# Patient Record
Sex: Male | Born: 1995 | Race: White | Hispanic: Yes | Marital: Married | Smoking: Never smoker
Health system: Southern US, Community
[De-identification: ages and names within clinical notes are randomized; demographics above are authoritative.]

## PROBLEM LIST (undated history)

## (undated) DIAGNOSIS — Q8500 Neurofibromatosis, unspecified: Secondary | ICD-10-CM

## (undated) HISTORY — DX: Neurofibromatosis, unspecified: Q85.00

## (undated) HISTORY — PX: NO PAST SURGERIES: SHX2092

---
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
## 2013-03-22 ENCOUNTER — Emergency Department: Payer: Self-pay | Admitting: Emergency Medicine

## 2013-03-22 LAB — URINALYSIS, COMPLETE
Bacteria: NONE SEEN
Bilirubin,UR: NEGATIVE
Glucose,UR: NEGATIVE mg/dL (ref 0–75)
Nitrite: NEGATIVE
Protein: NEGATIVE
Specific Gravity: 1.021 (ref 1.003–1.030)

## 2013-03-22 LAB — CBC
HCT: 44.1 % (ref 40.0–52.0)
HGB: 15.2 g/dL (ref 13.0–18.0)
MCH: 28.3 pg (ref 26.0–34.0)
MCHC: 34.4 g/dL (ref 32.0–36.0)
RDW: 13 % (ref 11.5–14.5)
WBC: 9.6 10*3/uL (ref 3.8–10.6)

## 2013-03-22 LAB — LIPASE, BLOOD: Lipase: 88 U/L (ref 73–393)

## 2013-03-23 LAB — COMPREHENSIVE METABOLIC PANEL
Albumin: 4.3 g/dL (ref 3.8–5.6)
Alkaline Phosphatase: 94 U/L — ABNORMAL LOW (ref 98–317)
Anion Gap: 0 — ABNORMAL LOW (ref 7–16)
Bilirubin,Total: 1.1 mg/dL — ABNORMAL HIGH (ref 0.2–1.0)
Chloride: 110 mmol/L — ABNORMAL HIGH (ref 98–107)
Co2: 28 mmol/L (ref 21–32)
Creatinine: 0.87 mg/dL (ref 0.60–1.30)
Glucose: 81 mg/dL (ref 65–99)
Osmolality: 274 (ref 275–301)
Potassium: 4.2 mmol/L (ref 3.5–5.1)
SGOT(AST): 36 U/L (ref 10–41)
SGPT (ALT): 57 U/L (ref 12–78)
Sodium: 138 mmol/L (ref 136–145)
Total Protein: 7.4 g/dL (ref 6.4–8.6)

## 2014-01-21 DIAGNOSIS — Q8501 Neurofibromatosis, type 1: Secondary | ICD-10-CM | POA: Insufficient documentation

## 2014-07-27 ENCOUNTER — Emergency Department: Payer: Self-pay | Admitting: Emergency Medicine

## 2016-02-20 ENCOUNTER — Emergency Department
Admission: EM | Admit: 2016-02-20 | Discharge: 2016-02-20 | Disposition: A | Discharge status: Home / Self Care | Payer: BLUE CROSS/BLUE SHIELD | Attending: Emergency Medicine | Admitting: Emergency Medicine

## 2016-02-20 ENCOUNTER — Encounter: Payer: Self-pay | Admitting: Emergency Medicine

## 2016-02-20 ENCOUNTER — Emergency Department: Payer: BLUE CROSS/BLUE SHIELD

## 2016-02-20 DIAGNOSIS — K8020 Calculus of gallbladder without cholecystitis without obstruction: Secondary | ICD-10-CM

## 2016-02-20 DIAGNOSIS — R1011 Right upper quadrant pain: Secondary | ICD-10-CM | POA: Diagnosis present

## 2016-02-20 LAB — COMPREHENSIVE METABOLIC PANEL
ALBUMIN: 4.9 g/dL (ref 3.5–5.0)
ALT: 43 U/L (ref 17–63)
ANION GAP: 7 (ref 5–15)
AST: 32 U/L (ref 15–41)
Alkaline Phosphatase: 68 U/L (ref 38–126)
BUN: 11 mg/dL (ref 6–20)
CALCIUM: 9.6 mg/dL (ref 8.9–10.3)
CHLORIDE: 110 mmol/L (ref 101–111)
CO2: 24 mmol/L (ref 22–32)
CREATININE: 0.83 mg/dL (ref 0.61–1.24)
GFR calc non Af Amer: >60 mL/min (ref >60)
Glucose, Bld: 95 mg/dL (ref 65–99)
POTASSIUM: 3.9 mmol/L (ref 3.5–5.1)
SODIUM: 141 mmol/L (ref 135–145)
Total Bilirubin: 1.6 mg/dL — ABNORMAL HIGH (ref 0.3–1.2)
Total Protein: 7.7 g/dL (ref 6.5–8.1)

## 2016-02-20 LAB — CBC
HCT: 48.7 % (ref 40.0–52.0)
Hemoglobin: 16.6 g/dL (ref 13.0–18.0)
MCH: 28.2 pg (ref 26.0–34.0)
MCHC: 34.1 g/dL (ref 32.0–36.0)
MCV: 82.7 fL (ref 80.0–100.0)
PLATELETS: 221 10*3/uL (ref 150–440)
RBC: 5.89 MIL/uL (ref 4.40–5.90)
RDW: 13.3 % (ref 11.5–14.5)
WBC: 7.9 10*3/uL (ref 3.8–10.6)

## 2016-02-20 LAB — LIPASE, BLOOD: LIPASE: 22 U/L (ref 11–51)

## 2016-02-20 LAB — TROPONIN I

## 2016-02-20 MED ORDER — HYDROCODONE-ACETAMINOPHEN 5-325 MG PO TABS: 1.0000 | ORAL_TABLET | Freq: Four times a day (QID) | ORAL | 0 refills | Status: DC | PRN

## 2016-02-20 NOTE — ED Triage Notes (Signed)
R upper abdominal pain x 3 days.

## 2016-02-20 NOTE — Discharge Instructions (Signed)
Please return immediately if condition worsens. Please contact her primary physician or the physician you were given for referral. If you have any specialist physicians involved in her treatment and plan please also contact them. Thank you for using [facility].  Return to the emergency department especially for unrelieved pain, vomiting, fever, or any other new concerns.

## 2016-02-20 NOTE — ED Provider Notes (Signed)
Time Seen: Approximately 1458 I have reviewed the triage notes  Chief Complaint: Abdominal Pain   History of Present Illness: Philip Cortez is a 20 y.o. male who presents with some intermittent right upper quadrant abdominal pain over the last 3 days. No obvious exacerbating or relieving factors. He states he's been doing a lot of sit ups to try to get in shape. He denies any fever at home, nausea, vomiting, epigastric pain, diarrhea, constipation etc. He denies any right lower quadrant abdominal pain. 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   History reviewed. No pertinent past medical history.  There are no active problems to display for this patient.   History reviewed. No pertinent surgical history.  History reviewed. No pertinent surgical history.  Current Outpatient Rx  . Order #: [id number] Class: Print    Allergies:  Review of patient's allergies indicates no known allergies.  Family History: No family history on file.  Social History: Social History  Substance Use Topics  . Smoking status: Never Smoker  . Smokeless tobacco: Not on file  . Alcohol use Yes     Review of Systems:   10 point review of systems was performed and was otherwise negative:  Constitutional: No fever Eyes: No visual disturbances ENT: No sore throat, ear pain Cardiac: No chest pain Respiratory: No shortness of breath, wheezing, or stridor Abdomen: Abdominal pain is right upper quadrant and not present during the time of evaluation Endocrine: No weight loss, No night sweats Extremities: No peripheral edema, cyanosis Skin: No rashes, easy bruising Neurologic: No focal weakness, trouble with speech or swollowing Urologic: No dysuria, Hematuria, or urinary frequency   Physical Exam:  ED Triage Vitals  Enc Vitals Group     BP 02/20/16 1230 137/64     Pulse Rate 02/20/16 1230 (!) 57     Resp 02/20/16 1230 20     Temp 02/20/16 1230 98.2 F (36.8 C)     Temp  Source 02/20/16 1230 Oral     SpO2 02/20/16 1230 99 %     Weight 02/20/16 1233 220 lb (99.8 kg)     Height 02/20/16 1233 5\' 9"  (1.753 m)     Head Circumference --      Peak Flow --      Pain Score --      Pain Loc --      Pain Edu? --      Excl. in GC? --     General: Awake , Alert , and Oriented times 3; GCS 15 Head: Normal cephalic , atraumatic Eyes: Pupils equal , round, reactive to light Nose/Throat: No nasal drainage, patent upper airway without erythema or exudate.  Neck: Supple, Full range of motion, No anterior adenopathy or palpable thyroid masses Lungs: Clear to ascultation without wheezes , rhonchi, or rales Heart: Regular rate, regular rhythm without murmurs , gallops , or rubs Abdomen: Soft, non tender without rebound, guarding , or rigidity; bowel sounds positive and symmetric in all 4 quadrants. No organomegaly .  Negative Murphy's sign. No tenderness over McBurney's point      Extremities: 2 plus symmetric pulses. No edema, clubbing or cyanosis Neurologic: normal ambulation, Motor symmetric without deficits, sensory intact Skin: warm, dry, no rashes   Labs:   All laboratory work was reviewed including any pertinent negatives or positives listed below:  Labs Reviewed  COMPREHENSIVE METABOLIC PANEL - Abnormal; Notable for the following:       Result Value   Total Bilirubin 1.6 (*)  All other components within normal limits  LIPASE, BLOOD  CBC  TROPONIN I  Laboratory work was reviewed and showed no clinically significant abnormalities.   EKG:  ED ECG REPORT I, Jennye MoccasinBrian S Quigley, the attending physician, personally viewed and interpreted this ECG.  Date: 02/20/2016 EKG Time: 1239 Rate: *59 Rhythm: normal sinus rhythm QRS Axis: normal Intervals: normal ST/T Wave abnormalities: normal Conduction Disturbances: none Narrative Interpretation: unremarkable    Radiology: * "[place] Abdomen Limited Ruq  Result Date: 02/20/2016 CLINICAL DATA:  Right upper  quadrant abdominal pain for 3 days. EXAM: US ABDOMEN LIMITED - RIGHT UPPER QUADRANT COMPARISON:  None. FINDINGS: Gallbladder: 1.5 cm gallstone is noted in neck of gallbladder. No gallbladder wall thickening or pericholecystic fluid is noted. No sonographic Murphy sign noted by sonographer. Common bile duct: Diameter: 3.2 mm which is within normal limits. Liver: Increased echogenicity of hepatic parenchyma is noted consistent with fatty infiltration, with sparing noted around the gallbladder fossa. IMPRESSION: Fatty infiltration of the liver. Solitary gallstone is noted without gallbladder wall thickening or pericholecystic fluid. Electronically Signed   By: Lupita RaiderJames  Green Jr, M.D.   On: 02/20/2016 15:46  "  I personally reviewed the radiologic studies    ED Course:   Patient's stay here was uneventful is differential includes gastritis, pancreatitis, cholecystitis, musculoskeletal pain. I felt most likely his pain is either coming from a musculoskeletal source but there is no current reproducible component to his discomfort. He also has a stone that seems to be at the gallbladder neck which may be causing some occasional: The left cyanosis discomfort. The bile ducts appear normal and he is afebrile with a normal white count, etc. I felt he did not require immediate surgical evaluation but did refer him to general surgeon as an outpatient. She was advised to eat a low-fat diet and will be prescribed Norco for occasional pain.  Clinical Course     Assessment:  Cholelithiasis  Final Clinical Impression:   Final diagnoses:  Right upper quadrant pain  Gallstones without obstruction of gallbladder     Plan:  Outpatient Rx for Norco Patient was advised to return immediately if condition worsens. Patient was advised to follow up with their primary care physician or other specialized physicians involved in their outpatient care. The patient and/or family member/power of attorney had laboratory  results reviewed at the bedside. All questions and concerns were addressed and appropriate discharge instructions were distributed by the nursing staff.            Jennye MoccasinBrian S Quigley, MD 02/20/16 [phone]

## 2016-02-23 ENCOUNTER — Encounter: Payer: Self-pay | Admitting: Surgery

## 2016-02-23 ENCOUNTER — Ambulatory Visit (INDEPENDENT_AMBULATORY_CARE_PROVIDER_SITE_OTHER): Payer: BLUE CROSS/BLUE SHIELD | Admitting: Surgery

## 2016-02-23 ENCOUNTER — Ambulatory Visit: Payer: Self-pay | Admitting: Surgery

## 2016-02-23 VITALS — BP 150/80 | HR 65 | Temp 98.1°F | Ht 69.0 in | Wt 219.0 lb

## 2016-02-23 DIAGNOSIS — K8020 Calculus of gallbladder without cholecystitis without obstruction: Secondary | ICD-10-CM

## 2016-02-23 NOTE — Patient Instructions (Signed)
We have scheduled your surgery for 03/09/16 with Dr.Cooper. Please see your [facility] pre-care sheet for surgery information. Please call our office if you have questions or concerns.

## 2016-02-23 NOTE — Progress Notes (Signed)
Surgical Consultation  02/23/2016  Philip Cortez is an 20 y.o. male.   CC: gAll stones  HPI: This a patient with 2 episodes of right upper quadrant pain that lasted approximately 45 minutes one of which sent him to the emergency room. He was evaluated and there was no sign of acute cholecystitis with normal liver function tests. He has had no nausea or vomiting and no abnormalities of his bowel movements. And is never had episodes like this before.  Past Medical History:  Diagnosis Date  . Neurofibromatosis (HCC)     History reviewed. No pertinent surgical history.  Family History  Problem Relation Age of Onset  . Neurofibromatosis Mother   . Thyroid disease Mother   . Diabetes Paternal Uncle   . Diabetes Maternal Grandfather   . Diabetes Paternal Grandfather     Social History:  reports that he has never smoked. He has never used smokeless tobacco. He reports that he does not drink alcohol or use drugs. His mother had her gallbladder removed at age 22. 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 Allergies: No Known Allergies  Medications reviewed.   Review of Systems:   Review of Systems  Constitutional: Negative for chills and fever.  HENT: Negative.   Eyes: Negative.   Respiratory: Negative.   Cardiovascular: Negative.   Gastrointestinal: Positive for abdominal pain. Negative for blood in stool, constipation, diarrhea, heartburn, melena, nausea and vomiting.  Genitourinary: Negative.   Musculoskeletal: Negative.   Skin: Negative.   Neurological: Negative.   Endo/Heme/Allergies: Negative.   Psychiatric/Behavioral: Negative.      Physical Exam:  BP (!) 150/80   Pulse 65   Temp 98.1 F (36.7 C) (Oral)   Ht 5\' 9"  (1.753 m)   Wt 219 lb (99.3 kg)   BMI 32.34 kg/m   Physical Exam  Constitutional: He is oriented to person, place, and time and well-developed, well-nourished, and in no distress. No distress.  HENT:  Head: Normocephalic and  atraumatic.  Eyes: Pupils are equal, round, and reactive to light. Right eye exhibits no discharge. Left eye exhibits no discharge. No scleral icterus.  Neck: Normal range of motion.  Cardiovascular: Normal rate, regular rhythm and normal heart sounds.   Pulmonary/Chest: Effort normal and breath sounds normal. No respiratory distress. He has no wheezes. He has no rales.  Abdominal: Soft. He exhibits no distension. There is no tenderness. There is no rebound and no guarding.  Musculoskeletal: Normal range of motion. He exhibits no edema or tenderness.  Lymphadenopathy:    He has no cervical adenopathy.  Neurological: He is alert and oriented to person, place, and time.  Skin: Skin is warm and dry. No rash noted. He is not diaphoretic. No erythema.  Psychiatric: Mood and affect normal.  Vitals reviewed.     No results found for this or any previous visit (from the past 48 hour(s)). No results found.  Assessment/Plan:  This a patient with symptomatically lithiasis he has classic symptoms and requires laparoscopic cholecystectomy for control of his symptoms. He has started new job and will talk to his HR people about when to schedule this. I discussed with he and his family the rationale for offering surgery the options of observation risk bleeding infection recurrence of symptoms failure to resolve his symptoms conversion to an open procedure bile duct damage or bowel injury any of which could require further surgery this reviewed for him and multiple questions were answered he understood and agreed to proceed.  Florene Glen, MD, FACS

## 2016-02-25 ENCOUNTER — Telehealth: Payer: Self-pay | Admitting: Surgery

## 2016-02-25 NOTE — Telephone Encounter (Signed)
Pt advised of pre op date/time and sx date. Sx: 03/09/16 with Dr Ludwig Clarksooper--Laparoscopic cholecystectomy.  Pre op: 03/01/16 between 1-5:00pm--Phone.   Patient made aware to call [phone], between 1-3:00pm the day before surgery, to find out what time to arrive.     Patient advised of Physician Estimate--1095.96. Patient informed of the minimum deposit for surgery is 500.00 prior to surgery.

## 2016-03-01 ENCOUNTER — Encounter
Admission: RE | Admit: 2016-03-01 | Discharge: 2016-03-01 | Disposition: A | Discharge status: Home / Self Care | Payer: BLUE CROSS/BLUE SHIELD | Source: Ambulatory Visit | Attending: Surgery | Admitting: Surgery

## 2016-03-01 NOTE — Patient Instructions (Signed)
  Your procedure is scheduled on: 03-09-16 [facility](WEDNESDAY) Report to Same Day Surgery 2nd floor medical mall To find out your arrival time please call [phone] between 1PM - 3PM on 03-08-16 (TUESDAY)  Remember: Instructions that are not followed completely may result in serious medical risk, up to and including death, or upon the discretion of your surgeon and anesthesiologist your surgery may need to be rescheduled.    _x___ 1. Do not eat food or drink liquids after midnight. No gum chewing or hard candies.     __x__ 2. No Alcohol for 24 hours before or after surgery.   __x__3. No Smoking for 24 prior to surgery.   ____  4. Bring all medications with you on the day of surgery if instructed.    __x__ 5. Notify your doctor if there is any change in your medical condition     (cold, fever, infections).     Do not wear jewelry, make-up, hairpins, clips or nail polish.  Do not wear lotions, powders, or perfumes. You may wear deodorant.  Do not shave 48 hours prior to surgery. Men may shave face and neck.  Do not bring valuables to the hospital.    [facility] is not responsible for any belongings or valuables.               Contacts, dentures or bridgework may not be worn into surgery.  Leave your suitcase in the car. After surgery it may be brought to your room.  For patients admitted to the hospital, discharge time is determined by your treatment team.   Patients discharged the day of surgery will not be allowed to drive home.    Please read over the following fact sheets that you were given:   [facility] Preparing for Surgery and or MRSA Information   ____ Take these medicines the morning of surgery with A SIP OF WATER:    1. NONE  2.  3.  4.  5.  6.  ____Fleets enema or Magnesium Citrate as directed.   ____ Use CHG Soap or sage wipes as directed on instruction sheet   ____ Use inhalers on the day of surgery and bring to hospital day of surgery  ____ Stop metformin 2  days prior to surgery    ____ Take 1/2 of usual insulin dose the night before surgery and none on the morning of           surgery.   ____ Stop aspirin or coumadin, or plavix  x__ Stop Anti-inflammatories such as Advil, Aleve, Ibuprofen, Motrin, Naproxen,          Naprosyn, Goodies powders or aspirin products NOW-Ok to take Tylenol.   ____ Stop supplements until after surgery.    ____ Bring C-Pap to the hospital.

## 2016-03-09 ENCOUNTER — Ambulatory Visit: Payer: BLUE CROSS/BLUE SHIELD | Admitting: Registered Nurse

## 2016-03-09 ENCOUNTER — Encounter: Payer: Self-pay | Admitting: *Deleted

## 2016-03-09 ENCOUNTER — Ambulatory Visit
Admission: RE | Admit: 2016-03-09 | Discharge: 2016-03-09 | Disposition: A | Discharge status: Home / Self Care | Payer: BLUE CROSS/BLUE SHIELD | Source: Ambulatory Visit | Attending: Surgery | Admitting: Surgery

## 2016-03-09 ENCOUNTER — Encounter
Admission: RE | Disposition: A | Discharge status: Home / Self Care | Payer: Self-pay | Source: Ambulatory Visit | Attending: Surgery

## 2016-03-09 DIAGNOSIS — K8010 Calculus of gallbladder with chronic cholecystitis without obstruction: Secondary | ICD-10-CM | POA: Insufficient documentation

## 2016-03-09 DIAGNOSIS — Q8500 Neurofibromatosis, unspecified: Secondary | ICD-10-CM | POA: Diagnosis not present

## 2016-03-09 DIAGNOSIS — K8050 Calculus of bile duct without cholangitis or cholecystitis without obstruction: Secondary | ICD-10-CM

## 2016-03-09 DIAGNOSIS — Z419 Encounter for procedure for purposes other than remedying health state, unspecified: Secondary | ICD-10-CM

## 2016-03-09 HISTORY — PX: CHOLECYSTECTOMY: SHX55

## 2016-03-09 LAB — CBC WITH DIFFERENTIAL/PLATELET
Basophils Absolute: 0.1 10*3/uL (ref 0–0.1)
Basophils Relative: 1 %
EOS ABS: 0.1 10*3/uL (ref 0–0.7)
EOS PCT: 1 %
HCT: 47.8 % (ref 40.0–52.0)
HEMOGLOBIN: 16.7 g/dL (ref 13.0–18.0)
LYMPHS ABS: 2.2 10*3/uL (ref 1.0–3.6)
Lymphocytes Relative: 24 %
MCH: 28.6 pg (ref 26.0–34.0)
MCHC: 35.1 g/dL (ref 32.0–36.0)
MCV: 81.7 fL (ref 80.0–100.0)
MONO ABS: 0.7 10*3/uL (ref 0.2–1.0)
MONOS PCT: 8 %
NEUTROS PCT: 66 %
Neutro Abs: 6.3 10*3/uL (ref 1.4–6.5)
Platelets: 242 10*3/uL (ref 150–440)
RBC: 5.85 MIL/uL (ref 4.40–5.90)
RDW: 13.2 % (ref 11.5–14.5)
WBC: 9.4 10*3/uL (ref 3.8–10.6)

## 2016-03-09 LAB — COMPREHENSIVE METABOLIC PANEL
ALK PHOS: 70 U/L (ref 38–126)
ALT: 47 U/L (ref 17–63)
ANION GAP: 5 (ref 5–15)
AST: 32 U/L (ref 15–41)
Albumin: 4.7 g/dL (ref 3.5–5.0)
BILIRUBIN TOTAL: 1.2 mg/dL (ref 0.3–1.2)
BUN: 11 mg/dL (ref 6–20)
CALCIUM: 9.6 mg/dL (ref 8.9–10.3)
CO2: 26 mmol/L (ref 22–32)
Chloride: 109 mmol/L (ref 101–111)
Creatinine, Ser: 0.81 mg/dL (ref 0.61–1.24)
GFR calc non Af Amer: >60 mL/min (ref >60)
Glucose, Bld: 107 mg/dL — ABNORMAL HIGH (ref 65–99)
Potassium: 4.4 mmol/L (ref 3.5–5.1)
SODIUM: 140 mmol/L (ref 135–145)
TOTAL PROTEIN: 7.6 g/dL (ref 6.5–8.1)

## 2016-03-09 SURGERY — LAPAROSCOPIC CHOLECYSTECTOMY
Anesthesia: General

## 2016-03-09 MED ORDER — KETOROLAC TROMETHAMINE 30 MG/ML IJ SOLN
INTRAMUSCULAR | Status: DC | PRN
  Administered 2016-03-09: 30 mg via INTRAVENOUS

## 2016-03-09 MED ORDER — SUGAMMADEX SODIUM 200 MG/2ML IV SOLN
INTRAVENOUS | Status: DC | PRN
  Administered 2016-03-09: 200 mg via INTRAVENOUS

## 2016-03-09 MED ORDER — ACETAMINOPHEN 10 MG/ML IV SOLN
INTRAVENOUS | Status: AC
  Filled 2016-03-09: qty 100

## 2016-03-09 MED ORDER — CHLORHEXIDINE GLUCONATE CLOTH 2 % EX PADS: 6.0000 | MEDICATED_PAD | Freq: Once | CUTANEOUS | Status: DC

## 2016-03-09 MED ORDER — ROCURONIUM BROMIDE 100 MG/10ML IV SOLN
INTRAVENOUS | Status: DC | PRN
  Administered 2016-03-09: 10 mg via INTRAVENOUS
  Administered 2016-03-09: 30 mg via INTRAVENOUS

## 2016-03-09 MED ORDER — PROPOFOL 10 MG/ML IV BOLUS
INTRAVENOUS | Status: DC | PRN
  Administered 2016-03-09: 200 mg via INTRAVENOUS
  Administered 2016-03-09: 100 mg via INTRAVENOUS

## 2016-03-09 MED ORDER — FAMOTIDINE 20 MG PO TABS
20.0000 mg | ORAL_TABLET | Freq: Once | ORAL | Status: AC
  Administered 2016-03-09: 20 mg via ORAL

## 2016-03-09 MED ORDER — FAMOTIDINE 20 MG PO TABS
ORAL_TABLET | ORAL | Status: AC
  Filled 2016-03-09: qty 1

## 2016-03-09 MED ORDER — CEFAZOLIN SODIUM-DEXTROSE 2-4 GM/100ML-% IV SOLN
2.0000 g | INTRAVENOUS | Status: AC
  Administered 2016-03-09: 2 g via INTRAVENOUS

## 2016-03-09 MED ORDER — ONDANSETRON HCL 4 MG/2ML IJ SOLN
INTRAMUSCULAR | Status: DC | PRN
  Administered 2016-03-09: 4 mg via INTRAVENOUS

## 2016-03-09 MED ORDER — BUPIVACAINE-EPINEPHRINE (PF) 0.25% -1:200000 IJ SOLN
INTRAMUSCULAR | Status: AC
  Filled 2016-03-09: qty 30

## 2016-03-09 MED ORDER — BUPIVACAINE-EPINEPHRINE (PF) 0.25% -1:200000 IJ SOLN
INTRAMUSCULAR | Status: DC | PRN
  Administered 2016-03-09: 30 mL via PERINEURAL

## 2016-03-09 MED ORDER — FENTANYL CITRATE (PF) 100 MCG/2ML IJ SOLN
INTRAMUSCULAR | Status: DC | PRN
  Administered 2016-03-09 (×4): 50 ug via INTRAVENOUS

## 2016-03-09 MED ORDER — LACTATED RINGERS IV SOLN
INTRAVENOUS | Status: DC
  Administered 2016-03-09: 10:00:00 via INTRAVENOUS

## 2016-03-09 MED ORDER — FENTANYL CITRATE (PF) 100 MCG/2ML IJ SOLN: 25.0000 ug | INTRAMUSCULAR | Status: DC | PRN

## 2016-03-09 MED ORDER — ACETAMINOPHEN 10 MG/ML IV SOLN
INTRAVENOUS | Status: DC | PRN
  Administered 2016-03-09: 1000 mg via INTRAVENOUS

## 2016-03-09 MED ORDER — MIDAZOLAM HCL 2 MG/2ML IJ SOLN
INTRAMUSCULAR | Status: DC | PRN
  Administered 2016-03-09: 2 mg via INTRAVENOUS

## 2016-03-09 MED ORDER — ONDANSETRON HCL 4 MG/2ML IJ SOLN: 4.0000 mg | Freq: Once | INTRAMUSCULAR | Status: DC | PRN

## 2016-03-09 MED ORDER — LIDOCAINE HCL (CARDIAC) 20 MG/ML IV SOLN
INTRAVENOUS | Status: DC | PRN
  Administered 2016-03-09: 80 mg via INTRAVENOUS

## 2016-03-09 MED ORDER — HYDROCODONE-ACETAMINOPHEN 5-300 MG PO TABS: 1.0000 | ORAL_TABLET | ORAL | 0 refills | Status: DC | PRN

## 2016-03-09 MED ORDER — CEFAZOLIN SODIUM-DEXTROSE 2-4 GM/100ML-% IV SOLN
INTRAVENOUS | Status: AC
  Filled 2016-03-09: qty 100

## 2016-03-09 SURGICAL SUPPLY — 43 items
ADHESIVE MASTISOL STRL (MISCELLANEOUS) ×3 IMPLANT
APPLIER CLIP ROT 10 11.4 M/L (STAPLE) ×3
BLADE SURG SZ11 CARB STEEL (BLADE) ×3 IMPLANT
CANISTER SUCT 1200ML W/VALVE (MISCELLANEOUS) ×3 IMPLANT
CATH CHOLANGI 4FR 420404F (CATHETERS) IMPLANT
CHLORAPREP W/TINT 26ML (MISCELLANEOUS) ×3 IMPLANT
CLIP APPLIE ROT 10 11.4 M/L (STAPLE) ×1 IMPLANT
CLOSURE WOUND 1/2 X4 (GAUZE/BANDAGES/DRESSINGS) ×1
CONRAY 60ML FOR OR (MISCELLANEOUS) IMPLANT
DRAPE C-ARM XRAY 36X54 (DRAPES) IMPLANT
ELECT REM PT RETURN 9FT ADLT (ELECTROSURGICAL) ×3
ELECTRODE REM PT RTRN 9FT ADLT (ELECTROSURGICAL) ×1 IMPLANT
ENDOPOUCH RETRIEVER 10 (MISCELLANEOUS) ×3 IMPLANT
GAUZE SPONGE NON-WVN 2X2 STRL (MISCELLANEOUS) ×4 IMPLANT
GLOVE BIO SURGEON STRL SZ8 (GLOVE) ×3 IMPLANT
GOWN STRL REUS W/ TWL LRG LVL3 (GOWN DISPOSABLE) ×4 IMPLANT
GOWN STRL REUS W/TWL LRG LVL3 (GOWN DISPOSABLE) ×8
IRRIGATION STRYKERFLOW (MISCELLANEOUS) IMPLANT
IRRIGATOR STRYKERFLOW (MISCELLANEOUS)
IV CATH ANGIO 12GX3 LT BLUE (NEEDLE) ×3 IMPLANT
IV NS 1000ML (IV SOLUTION)
IV NS 1000ML BAXH (IV SOLUTION) IMPLANT
JACKSON PRATT 10 (INSTRUMENTS) IMPLANT
KIT RM TURNOVER STRD PROC AR (KITS) ×3 IMPLANT
LABEL OR SOLS (LABEL) ×3 IMPLANT
NDL SAFETY 22GX1.5 (NEEDLE) ×3 IMPLANT
NEEDLE VERESS 14GA 120MM (NEEDLE) ×3 IMPLANT
NS IRRIG 500ML POUR BTL (IV SOLUTION) ×3 IMPLANT
PACK LAP CHOLECYSTECTOMY (MISCELLANEOUS) ×3 IMPLANT
SCISSORS METZENBAUM CVD 33 (INSTRUMENTS) ×3 IMPLANT
SLEEVE ENDOPATH XCEL 5M (ENDOMECHANICALS) ×6 IMPLANT
SPONGE EXCIL AMD DRAIN 4X4 6P (MISCELLANEOUS) IMPLANT
SPONGE LAP 18X18 5 PK (GAUZE/BANDAGES/DRESSINGS) ×3 IMPLANT
SPONGE VERSALON 2X2 STRL (MISCELLANEOUS) ×8
STRIP CLOSURE SKIN 1/2X4 (GAUZE/BANDAGES/DRESSINGS) ×2 IMPLANT
SUT MNCRL 4-0 (SUTURE) ×2
SUT MNCRL 4-0 27XMFL (SUTURE) ×1
SUT VICRYL 0 AB UR-6 (SUTURE) ×3 IMPLANT
SUTURE MNCRL 4-0 27XMF (SUTURE) ×1 IMPLANT
SYR 20CC LL (SYRINGE) ×3 IMPLANT
TROCAR XCEL NON-BLD 11X100MML (ENDOMECHANICALS) ×3 IMPLANT
TROCAR XCEL NON-BLD 5MMX100MML (ENDOMECHANICALS) ×3 IMPLANT
TUBING INSUFFLATOR HI FLOW (MISCELLANEOUS) ×3 IMPLANT

## 2016-03-09 NOTE — Transfer of Care (Signed)
Immediate Anesthesia Transfer of Care Note  Patient: Philip PlanasMartin N Wissmann  Procedure(s) Performed: Procedure(s): LAPAROSCOPIC CHOLECYSTECTOMY (N/A)  Patient Location: PACU  Anesthesia Type:General  Level of Consciousness: sedated  Airway & Oxygen Therapy: Patient Spontanous Breathing and Patient connected to face mask oxygen  Post-op Assessment: Report given to RN and Post -op Vital signs reviewed and stable  Post vital signs: Reviewed and stable  Last Vitals:  Vitals:   03/09/16 0908  BP: 135/68  Pulse: 64  Resp: 16  Temp: 36.7 C    Last Pain:  Vitals:   03/09/16 0908  TempSrc: Oral         Complications: No apparent anesthesia complications

## 2016-03-09 NOTE — Anesthesia Preprocedure Evaluation (Addendum)
Anesthesia Evaluation  Patient identified by MRN, date of birth, ID band Patient awake    Reviewed: Allergy & Precautions, NPO status , Patient's Chart, lab work & pertinent test results  Airway Mallampati: II       Dental  (+) Chipped   Pulmonary neg pulmonary ROS,    Pulmonary exam normal        Cardiovascular negative cardio ROS Normal cardiovascular exam     Neuro/Psych negative psych ROS   GI/Hepatic Neg liver ROS,   Endo/Other    Renal/GU negative Renal ROS  negative genitourinary   Musculoskeletal   Abdominal Normal abdominal exam  (+)   Peds negative pediatric ROS (+)  Hematology negative hematology ROS (+)   Anesthesia Other Findings Past Medical History: No date: Neurofibromatosis [facility](HCC)  Reproductive/Obstetrics                            Anesthesia Physical Anesthesia Plan  ASA: II  Anesthesia Plan: General   Post-op Pain Management:    Induction: Intravenous  Airway Management Planned: Oral ETT  Additional Equipment:   Intra-op Plan:   Post-operative Plan: Extubation in OR  Informed Consent: I have reviewed the patients History and Physical, chart, labs and discussed the procedure including the risks, benefits and alternatives for the proposed anesthesia with the patient or authorized representative who has indicated his/her understanding and acceptance.   Dental advisory given  Plan Discussed with: CRNA and Surgeon  Anesthesia Plan Comments:         Anesthesia Quick Evaluation

## 2016-03-09 NOTE — Discharge Instructions (Signed)
Remove dressing in 24 hours. °May shower in 24 hours. °Leave paper strips in place. °Resume all home medications. °Follow-up with Dr. Cooper in 10 days. ° °AMBULATORY SURGERY  °DISCHARGE INSTRUCTIONS ° ° °1) The drugs that you were given will stay in your system until tomorrow so for the next 24 hours you should not: ° °A) Drive an automobile °B) Make any legal decisions °C) Drink any alcoholic beverage ° ° °2) You may resume regular meals tomorrow.  Today it is better to start with liquids and gradually work up to solid foods. ° °You may eat anything you prefer, but it is better to start with liquids, then soup and crackers, and gradually work up to solid foods. ° ° °3) Please notify your doctor immediately if you have any unusual bleeding, trouble breathing, redness and pain at the surgery site, drainage, fever, or pain not relieved by medication. ° ° ° °4) Additional Instructions: ° ° ° ° ° ° ° °Please contact your physician with any problems or Same Day Surgery at [phone], Monday through Friday 6 am to 4 pm, or [place] [facility] number at [phone]. °

## 2016-03-09 NOTE — Anesthesia Procedure Notes (Signed)
Procedure Name: Intubation Date/Time: 03/09/2016 11:23 AM Performed by: Karoline CaldwellSTARR, Tere Mcconaughey Pre-anesthesia Checklist: Patient identified, Emergency Drugs available, Suction available, Patient being monitored and Timeout performed Patient Re-evaluated:Patient Re-evaluated prior to inductionOxygen Delivery Method: Circle system utilized Preoxygenation: Pre-oxygenation with 100% oxygen Intubation Type: IV induction Ventilation: Mask ventilation without difficulty Laryngoscope Size: Mac and 4 Grade View: Grade I Tube type: Oral Tube size: 7.5 mm Number of attempts: 1 Airway Equipment and Method: Stylet Placement Confirmation: ETT inserted through vocal cords under direct vision,  positive ETCO2 and breath sounds checked- equal and bilateral Secured at: 23 (lip) cm Tube secured with: Tape Dental Injury: Teeth and Oropharynx as per pre-operative assessment

## 2016-03-09 NOTE — Anesthesia Postprocedure Evaluation (Signed)
Anesthesia Post Note  Patient: Iran PlanasMartin N Goerner  Procedure(s) Performed: Procedure(s) (LRB): LAPAROSCOPIC CHOLECYSTECTOMY (N/A)  Patient location during evaluation: PACU Anesthesia Type: General Level of consciousness: awake and alert and oriented Pain management: pain level controlled Vital Signs Assessment: post-procedure vital signs reviewed and stable Respiratory status: spontaneous breathing Cardiovascular status: blood pressure returned to baseline Anesthetic complications: no    Last Vitals:  Vitals:   03/09/16 1252 03/09/16 1302  BP: 135/81 114/75  Pulse: (!) 58 66  Resp: 16 18  Temp: 36.5 C 36.7 C    Last Pain:  Vitals:   03/09/16 1252  TempSrc:   PainSc: 0-No pain                 Christmas Faraci

## 2016-03-09 NOTE — Op Note (Signed)
Laparoscopic Cholecystectomy  Pre-operative Diagnosis: Biliary colic  Post-operative Diagnosis: Same  Procedure: Laparoscopic cholecystectomy  Surgeon: Adah Salvageichard E. Excell Seltzerooper, MD FACS  Anesthesia: Gen. with endotracheal tube  Assistant: PA student  Procedure Details  The patient was seen again in the Holding Room. The benefits, complications, treatment options, and expected outcomes were discussed with the patient. The risks of bleeding, infection, recurrence of symptoms, failure to resolve symptoms, bile duct damage, bile duct leak, retained common bile duct stone, bowel injury, any of which could require further surgery and/or ERCP, stent, or papillotomy were reviewed with the patient. The likelihood of improving the patient's symptoms with return to their baseline status is good.  The patient and/or family concurred with the proposed plan, giving informed consent.  The patient was taken to Operating Room, identified as Iran PlanasMartin N [place] and the procedure verified as Laparoscopic Cholecystectomy.  A Time Out was held and the above information confirmed.  Prior to the induction of general anesthesia, antibiotic prophylaxis was administered. VTE prophylaxis was in place. General endotracheal anesthesia was then administered and tolerated well. After the induction, the abdomen was prepped with Chloraprep and draped in the sterile fashion. The patient was positioned in the supine position.  Local anesthetic  was injected into the skin near the umbilicus and an incision made. The Veress needle was placed. Pneumoperitoneum was then created with CO2 and tolerated well without any adverse changes in the patient's vital signs. A 5mm port was placed in the periumbilical position and the abdominal cavity was explored.  Two 5-mm ports were placed in the right upper quadrant and a 12 mm epigastric port was placed all under direct vision. All skin incisions  were infiltrated with a local anesthetic agent before  making the incision and placing the trocars.   The patient was positioned  in reverse Trendelenburg, tilted slightly to the patient's left.  The gallbladder was identified, the fundus grasped and retracted cephalad. Adhesions were lysed bluntly. The infundibulum was grasped and retracted laterally, exposing the peritoneum overlying the triangle of Calot. This was then divided and exposed in a blunt fashion. A critical view of the cystic duct and cystic artery was obtained.  The cystic duct was clearly identified and bluntly dissected.   The cystic duct was well identified and doubly clipped and divided. The cystic artery was then doubly clipped and divided.  The gallbladder was taken from the gallbladder fossa in a retrograde fashion with the electrocautery. The gallbladder was removed and placed in an Endocatch bag. The liver bed was irrigated and inspected. Hemostasis was achieved with the electrocautery. Copious irrigation was utilized and was repeatedly aspirated until clear.  The gallbladder and Endocatch sac were then removed through the epigastric port site.   Inspection of the right upper quadrant was performed. No bleeding, bile duct injury or leak, or bowel injury was noted. Pneumoperitoneum was released.  The epigastric port site was closed with figure-of-eight 0 Vicryl sutures. 4-0 subcuticular Monocryl was used to close the skin. Steristrips and Mastisol and sterile dressings were  applied.  The patient was then extubated and brought to the recovery room in stable condition. Sponge, lap, and needle counts were correct at closure and at the conclusion of the case.   Findings: Chronic Cholecystitis   Estimated Blood Loss: Minimal         Drains: None         Specimens: Gallbladder           Complications: none  Blakelyn Dinges E. Burt Knack, MD, FACS

## 2016-03-09 NOTE — Progress Notes (Signed)
Preoperative Review   Patient is met in the preoperative holding area. The history is reviewed in the chart and with the patient. I personally reviewed the options and rationale as well as the risks of this procedure that have been previously discussed with the patient. All questions asked by the patient and/or family were answered to their satisfaction.  Patient agrees to proceed with this procedure at this time.  Philip Cortez E Niyah Mamaril M.D. FACS  

## 2016-03-09 NOTE — OR Nursing (Signed)
Lab tech in for cbc, met c draw

## 2016-03-11 LAB — SURGICAL PATHOLOGY

## 2016-03-12 ENCOUNTER — Encounter: Payer: Self-pay | Admitting: Surgery

## 2016-03-13 ENCOUNTER — Encounter: Payer: Self-pay | Admitting: Surgery

## 2016-03-23 ENCOUNTER — Ambulatory Visit: Payer: BLUE CROSS/BLUE SHIELD | Admitting: Surgery

## 2016-04-04 ENCOUNTER — Encounter: Payer: Self-pay | Admitting: Surgery

## 2016-04-04 ENCOUNTER — Ambulatory Visit (INDEPENDENT_AMBULATORY_CARE_PROVIDER_SITE_OTHER): Payer: BLUE CROSS/BLUE SHIELD | Admitting: Surgery

## 2016-04-04 VITALS — BP 143/78 | HR 59 | Temp 98.7°F | Ht 69.0 in | Wt 231.0 lb

## 2016-04-04 DIAGNOSIS — Z9049 Acquired absence of other specified parts of digestive tract: Secondary | ICD-10-CM

## 2016-04-04 DIAGNOSIS — K8050 Calculus of bile duct without cholangitis or cholecystitis without obstruction: Secondary | ICD-10-CM

## 2016-04-04 NOTE — Progress Notes (Signed)
04/04/2016  HPI: Patient is status post laparoscopic cholecystectomy with Dr. Excell Seltzerooper on 10/18. He presents today for postop visit. He reports that he's been doing well and tolerating a regular diet with no significant pain and normal bowel function. He has returned to work already and has not been having any issues.  Vital signs: BP (!) 143/78   Pulse (!) 59   Temp 98.7 F (37.1 C) (Oral)   Ht 5\' 9"  (1.753 m)   Wt 104.8 kg (231 lb)   BMI 34.11 kg/m    Physical Exam: Constitutional: No acute distress Abdomen: Soft, nondistended, nontender to palpation. All incisions are clean dry and intact and healing well with no evidence of infection.  Assessment/Plan: 20 year old male status post upper scopic cholecystectomy on 10/18.  -Patient may resume all activities on 11/15. -He may follow-up on an as-needed basis or if there are any questions or concerns about the wounds.   Howie IllJose Luis Kaleel Schmieder, MD [facility]

## 2016-04-04 NOTE — Patient Instructions (Signed)

## 2016-08-10 ENCOUNTER — Emergency Department
Admission: EM | Admit: 2016-08-10 | Discharge: 2016-08-10 | Disposition: A | Discharge status: Home / Self Care | Payer: BLUE CROSS/BLUE SHIELD | Attending: Emergency Medicine | Admitting: Emergency Medicine

## 2016-08-10 ENCOUNTER — Encounter: Payer: Self-pay | Admitting: Emergency Medicine

## 2016-08-10 ENCOUNTER — Emergency Department: Payer: BLUE CROSS/BLUE SHIELD

## 2016-08-10 DIAGNOSIS — J019 Acute sinusitis, unspecified: Secondary | ICD-10-CM

## 2016-08-10 DIAGNOSIS — J029 Acute pharyngitis, unspecified: Secondary | ICD-10-CM | POA: Diagnosis present

## 2016-08-10 LAB — POCT RAPID STREP A: STREPTOCOCCUS, GROUP A SCREEN (DIRECT): NEGATIVE

## 2016-08-10 MED ORDER — AMOXICILLIN-POT CLAVULANATE 875-125 MG PO TABS
1.0000 | ORAL_TABLET | Freq: Once | ORAL | Status: AC
  Administered 2016-08-10: 1 via ORAL
  Filled 2016-08-10: qty 1

## 2016-08-10 MED ORDER — AMOXICILLIN-POT CLAVULANATE 875-125 MG PO TABS: 1.0000 | ORAL_TABLET | Freq: Two times a day (BID) | ORAL | 0 refills | Status: AC

## 2016-08-10 NOTE — ED Triage Notes (Signed)
Patient to ER for c/o sore throat, nasal congestion and drainage. States he had fever on Saturday, but none since. Reports wife was just sick with "sinus infection". Patient states he currently has sore throat, has been coughing up blood tinged mucus.

## 2016-08-10 NOTE — ED Provider Notes (Signed)
[facility] Emergency Department Provider Note   First MD Initiated Contact with Patient 08/10/16 0510     (approximate)  I have reviewed the triage vital signs and the nursing notes.   HISTORY  Chief Complaint Sore Throat and URI    HPI Philip Cortez is a 21 y.o. male presents with sore throat and nasal congestion and cough 3 days. Patient states that his wife has had similar symptoms and was diagnosed with sinus infection. Patient states that he had a fever on Saturday with none since.   Past Medical History:  Diagnosis Date  . Neurofibromatosis [facility])     Patient Active Problem List   Diagnosis Date Noted  . Status post laparoscopic cholecystectomy 04/04/2016  . 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 CHOLECYSTECTOMY N/A 03/09/2016   Procedure: LAPAROSCOPIC CHOLECYSTECTOMY;  Surgeon: Lattie Haw, MD;  Location: [facility] ORS;  Service: General;  Laterality: N/A;  . NO PAST SURGERIES      Prior to Admission medications   Medication Sig Start Date End Date Taking? Authorizing Provider  amoxicillin-clavulanate (AUGMENTIN) 875-125 MG tablet Take 1 tablet by mouth 2 (two) times daily. 08/10/16 08/20/16  Darci Current, MD    Allergies Patient has no known allergies.  Family History  Problem Relation Age of Onset  . Neurofibromatosis Mother   . Thyroid disease Mother   . Diabetes Paternal Uncle   . Diabetes Maternal Grandfather   . Diabetes Paternal Grandfather     Social History Social History  Substance Use Topics  . Smoking status: Never Smoker  . Smokeless tobacco: Never Used  . Alcohol use No    Review of Systems Constitutional: No fever/chills Eyes: No visual changes. ENT: Positive for sore throat. Positive for nasal congestion Cardiovascular: Denies chest  pain. Respiratory: Denies shortness of breath. Positive for cough Gastrointestinal: No abdominal pain.  No nausea, no vomiting.  No diarrhea.  No constipation. Genitourinary: Negative for dysuria. Musculoskeletal: Negative for back pain. Skin: Negative for rash. Neurological: Negative for headaches, focal weakness or numbness.  10-point ROS otherwise negative.  ____________________________________________   PHYSICAL EXAM:  VITAL SIGNS: ED Triage Vitals  Enc Vitals Group     BP 08/10/16 0320 138/68     Pulse Rate 08/10/16 0320 93     Resp 08/10/16 0320 20     Temp 08/10/16 0320 99.2 F (37.3 C)     Temp Source 08/10/16 0320 Oral     SpO2 08/10/16 0320 98 %     Weight 08/10/16 0320 230 lb (104.3 kg)     Height 08/10/16 0320 5\' 9"  (1.753 m)     Head Circumference --      Peak Flow --      Pain Score 08/10/16 0321 8     Pain Loc --      Pain Edu? --      Excl. in GC? --     Constitutional: Alert and oriented. Well appearing and in no acute distress. Eyes: Conjunctivae are normal. PERRL. EOMI. Head: Atraumatic.Pain with palpation maxillary sinus bilaterally Nose: No congestion/rhinnorhea. Mouth/Throat: Mucous membranes are moist. Pharyngeal erythema no exudate noted  Neck: No stridor.  Positive anterior cervical lymphadenopathy Cardiovascular: Normal rate, regular rhythm. Good peripheral circulation. Grossly normal heart sounds. Respiratory: Normal respiratory effort.  No retractions. Lungs CTAB. Gastrointestinal: Soft and nontender. No distention.  Neurologic:  Normal speech and language. No gross focal neurologic deficits are appreciated.  Skin:  Skin is warm, dry and intact. No rash noted. Psychiatric: Mood and affect are normal. Speech and behavior are normal.   RADIOLOGY I, [place] N Jernee Murtaugh, personally viewed and evaluated these images (plain radiographs) as part of my medical decision making, as well as reviewing the written report by the radiologist.  Dg Chest 2  View  Result Date: 08/10/2016 CLINICAL DATA:  21 year old male with cough EXAM: CHEST  2 VIEW COMPARISON:  None. FINDINGS: The heart size and mediastinal contours are within normal limits. Both lungs are clear. The visualized skeletal structures are unremarkable. IMPRESSION: No active cardiopulmonary disease. Electronically Signed   By: Elgie CollardArash  Radparvar M.D.   On: 08/10/2016 05:39    ____________________________________________   Procedures   ____________________________________________   INITIAL IMPRESSION / ASSESSMENT AND PLAN / ED COURSE  Pertinent labs & imaging results that were available during my care of the patient were reviewed by me and considered in my medical decision making (see chart for details).  History physical exam concern for possible sinusitis/pharyngitis. Patient given Augmentin the emergency department will be prescribed same for home      ____________________________________________  FINAL CLINICAL IMPRESSION(S) / ED DIAGNOSES  Final diagnoses:  Acute non-recurrent sinusitis, unspecified location     MEDICATIONS GIVEN DURING THIS VISIT:  Medications  amoxicillin-clavulanate (AUGMENTIN) 875-125 MG per tablet 1 tablet (1 tablet Oral Given 08/10/16 0533)     NEW OUTPATIENT MEDICATIONS STARTED DURING THIS VISIT:  New Prescriptions   AMOXICILLIN-CLAVULANATE (AUGMENTIN) 875-125 MG TABLET    Take 1 tablet by mouth 2 (two) times daily.    Modified Medications   No medications on file    Discontinued Medications   No medications on file     Note:  This document was prepared using Dragon voice recognition software and may include unintentional dictation errors.    Darci Currentandolph N John Vasconcelos, MD 08/10/16 [phone]

## 2016-08-10 NOTE — ED Notes (Signed)
Pt discharged to home.  Discharge instructions reviewed.  Verbalized understanding.  No questions or concerns at this time.  Teach back verified.  Pt in NAD.  No items left in ED.   

## 2016-09-05 ENCOUNTER — Emergency Department
Admission: EM | Admit: 2016-09-05 | Discharge: 2016-09-05 | Disposition: A | Discharge status: Home / Self Care | Payer: BLUE CROSS/BLUE SHIELD | Attending: Emergency Medicine | Admitting: Emergency Medicine

## 2016-09-05 ENCOUNTER — Encounter: Payer: Self-pay | Admitting: *Deleted

## 2016-09-05 DIAGNOSIS — L03012 Cellulitis of left finger: Secondary | ICD-10-CM | POA: Diagnosis not present

## 2016-09-05 DIAGNOSIS — L03011 Cellulitis of right finger: Secondary | ICD-10-CM

## 2016-09-05 DIAGNOSIS — M79645 Pain in left finger(s): Secondary | ICD-10-CM | POA: Diagnosis present

## 2016-09-05 MED ORDER — CEPHALEXIN 500 MG PO CAPS: 500.0000 mg | ORAL_CAPSULE | Freq: Four times a day (QID) | ORAL | 0 refills | Status: DC

## 2016-09-05 NOTE — ED Provider Notes (Signed)
[facility] Emergency Department Provider Note  ____________________________________________  Time seen: Approximately 9:40 PM  I have reviewed the triage vital signs and the nursing notes.   HISTORY  Chief Complaint Hand Pain    HPI Philip Cortez is a 21 y.o. male who presents emergency department complaining of infectionto the proximal nail bed of the third digit left hand. Patient denies any injury to the area. He reports the area became erythematous and edematous and painful. Patient attempted to "pop it" with a sterilized needle. No drainage was expressed. Patient denies any circumferential erythema or edema. No edema to the finger pad. No pain traveling along the extensor tendon. No other complaints at this time.   Past Medical History:  Diagnosis Date  . Neurofibromatosis [facility])     Patient Active Problem List   Diagnosis Date Noted  . Status post laparoscopic cholecystectomy 04/04/2016  . 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 CHOLECYSTECTOMY N/A 03/09/2016   Procedure: LAPAROSCOPIC CHOLECYSTECTOMY;  Surgeon: Lattie Haw, MD;  Location: [facility] ORS;  Service: General;  Laterality: N/A;  . NO PAST SURGERIES      Prior to Admission medications   Medication Sig Start Date End Date Taking? Authorizing Provider  cephALEXin (KEFLEX) 500 MG capsule Take 1 capsule (500 mg total) by mouth 4 (four) times daily. 09/05/16   Delorise Royals Kellene Mccleary, PA-C    Allergies Patient has no known allergies.  Family History  Problem Relation Age of Onset  . Neurofibromatosis Mother   . Thyroid disease Mother   . Diabetes Paternal Uncle   . Diabetes Maternal Grandfather   . Diabetes Paternal Grandfather     Social History Social History  Substance Use Topics  . Smoking status: Never Smoker  . Smokeless  tobacco: Never Used  . Alcohol use No     Review of Systems  Constitutional: No fever/chills Cardiovascular: no chest pain. Respiratory: no cough. No SOB. Musculoskeletal: Negative for musculoskeletal pain. Skin: Negative for rash, abrasions, lacerations, ecchymosis.Positive for erythema and edema just proximal to the nail bed of the third digit left hand Neurological: Negative for headaches, focal weakness or numbness. 10-point ROS otherwise negative.  ____________________________________________   PHYSICAL EXAM:  VITAL SIGNS: ED Triage Vitals  Enc Vitals Group     BP 09/05/16 2007 (!) 154/81     Pulse Rate 09/05/16 2007 94     Resp 09/05/16 2007 18     Temp 09/05/16 2007 100 F (37.8 C)     Temp Source 09/05/16 2007 Oral     SpO2 09/05/16 2007 99 %     Weight 09/05/16 2008 230 lb (104.3 kg)     Height 09/05/16 2008  (1.753 m)     Head Circumference --      Peak Flow --      Pain Score 09/05/16 2007 2     Pain Loc --      Pain Edu? --      Excl. in GC? --      Constitutional: Alert and oriented. Well appearing and in no acute distress. Eyes: Conjunctivae are normal. PERRL. EOMI. Head: Atraumatic. Neck: No stridor.    Cardiovascular: Normal rate, regular rhythm. Normal S1 and S2.  Good peripheral circulation. Respiratory: Normal respiratory effort without tachypnea or retractions. Lungs CTAB. Good air entry to the bases with no decreased or absent breath sounds.  Musculoskeletal: Full range of motion to all extremities. No gross deformities appreciated. Neurologic:  Normal speech and language. No gross focal neurologic deficits are appreciated.  Skin:  Skin is warm, dry and intact. No rash noted.Mild erythema and edema noted to the proximal nail bed of the third digit left hand. This is non-circumferential. No finger pad involvement. Full range of motion to the digit. Sensation Refill intact. Area is mildly tender to palpation. No induration or  fluctuance. Psychiatric: Mood and affect are normal. Speech and behavior are normal. Patient exhibits appropriate insight and judgement.   ____________________________________________   LABS (all labs ordered are listed, but only abnormal results are displayed)  Labs Reviewed - No data to display ____________________________________________  EKG   ____________________________________________  RADIOLOGY   No results found.  ____________________________________________    PROCEDURES  Procedure(s) performed:    Procedures    Medications - No data to display   ____________________________________________   INITIAL IMPRESSION / ASSESSMENT AND PLAN / ED COURSE  Pertinent labs & imaging results that were available during my care of the patient were reviewed by me and considered in my medical decision making (see chart for details).  Review of the [place] CSRS was performed in accordance of the [facility] prior to dispensing any controlled drugs.     Patient's diagnosis is consistent with mild paronychia to the third digit of the left hand. No drainable abscess.. Patient will be discharged home with prescriptions for antibiotics. Patient is to follow up with primary care as needed or otherwise directed. Patient is given ED precautions to return to the ED for any worsening or new symptoms.     ____________________________________________  FINAL CLINICAL IMPRESSION(S) / ED DIAGNOSES  Final diagnoses:  Paronychia of finger of right hand      NEW MEDICATIONS STARTED DURING THIS VISIT:  New Prescriptions   CEPHALEXIN (KEFLEX) 500 MG CAPSULE    Take 1 capsule (500 mg total) by mouth 4 (four) times daily.        This chart was dictated using voice recognition software/Dragon. Despite best efforts to proofread, errors can occur which can change the meaning. Any change was purely unintentional.    Racheal Patches, PA-C 09/05/16 1610    Sharman Cheek,  MD 09/05/16 [phone]

## 2016-09-05 NOTE — ED Triage Notes (Signed)
Pt has pain in left 3rd finger.  Swelling and redness around nailbed.  No known injury to finger.

## 2018-01-17 ENCOUNTER — Other Ambulatory Visit: Payer: Self-pay

## 2018-01-17 ENCOUNTER — Emergency Department: Payer: BLUE CROSS/BLUE SHIELD

## 2018-01-17 ENCOUNTER — Emergency Department
Admission: EM | Admit: 2018-01-17 | Discharge: 2018-01-17 | Disposition: A | Discharge status: Home / Self Care | Payer: BLUE CROSS/BLUE SHIELD | Attending: Emergency Medicine | Admitting: Emergency Medicine

## 2018-01-17 ENCOUNTER — Encounter: Payer: Self-pay | Admitting: Emergency Medicine

## 2018-01-17 DIAGNOSIS — Z79899 Other long term (current) drug therapy: Secondary | ICD-10-CM | POA: Insufficient documentation

## 2018-01-17 DIAGNOSIS — R51 Headache: Secondary | ICD-10-CM | POA: Insufficient documentation

## 2018-01-17 DIAGNOSIS — R519 Headache, unspecified: Secondary | ICD-10-CM

## 2018-01-17 LAB — BASIC METABOLIC PANEL
Anion gap: 7 (ref 5–15)
BUN: 16 mg/dL (ref 6–20)
CHLORIDE: 109 mmol/L (ref 98–111)
CO2: 25 mmol/L (ref 22–32)
CREATININE: 0.94 mg/dL (ref 0.61–1.24)
Calcium: 9.7 mg/dL (ref 8.9–10.3)
Glucose, Bld: 93 mg/dL (ref 70–99)
POTASSIUM: 3.9 mmol/L (ref 3.5–5.1)
SODIUM: 141 mmol/L (ref 135–145)

## 2018-01-17 LAB — CBC
HCT: 47.7 % (ref 40.0–52.0)
HEMOGLOBIN: 16.6 g/dL (ref 13.0–18.0)
MCH: 28.9 pg (ref 26.0–34.0)
MCHC: 34.9 g/dL (ref 32.0–36.0)
MCV: 82.9 fL (ref 80.0–100.0)
Platelets: 293 10*3/uL (ref 150–440)
RBC: 5.76 MIL/uL (ref 4.40–5.90)
RDW: 13.3 % (ref 11.5–14.5)
WBC: 9.6 10*3/uL (ref 3.8–10.6)

## 2018-01-17 MED ORDER — NAPROXEN 500 MG PO TABS: 500.0000 mg | ORAL_TABLET | Freq: Two times a day (BID) | ORAL | Status: DC

## 2018-01-17 NOTE — ED Notes (Addendum)
See triage note  States he is having intermittent sharp pain to right temple area for couple of days   Subjective fever last pm  Afebrile on arrival  Also has been having some n/v  Mucous membranes moist  Also states he has been working out lately

## 2018-01-17 NOTE — ED Triage Notes (Signed)
Pt states intermittent sharp pains on right side of head at temple for a few days now. Appears in NAD. Does not state hx of migraine.

## 2018-01-17 NOTE — Discharge Instructions (Signed)
Advised NSAIDs at this time.  Follow-up with neurology for definitive evaluation.

## 2018-01-17 NOTE — ED Provider Notes (Signed)
[facility] Emergency Department Provider Note   ____________________________________________   First MD Initiated Contact with Patient 01/17/18 1051     (approximate)  I have reviewed the triage vital signs and the nursing notes.   HISTORY  Chief Complaint Headache    HPI Philip Cortez is a 22 y.o. male patient complain of 3 days of intermittent sharp pain to the right temporal area of his skull.  Patient states the pain is intense lasting less than 2-3 seconds.  Patient denies vision loss or vertigo.  Patient state frequency has increased in the last 24 hours.  Patient is having 8-10 episodes in a day.  Past Medical History:  Diagnosis Date  . Neurofibromatosis [facility](HCC)     Patient Active Problem List   Diagnosis Date Noted  . Status post laparoscopic cholecystectomy 04/04/2016  . 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 CHOLECYSTECTOMY N/A 03/09/2016   Procedure: LAPAROSCOPIC CHOLECYSTECTOMY;  Surgeon: Lattie Hawichard E Cooper, MD;  Location: [facility] ORS;  Service: General;  Laterality: N/A;  . NO PAST SURGERIES      Prior to Admission medications   Medication Sig Start Date End Date Taking? Authorizing Provider  cephALEXin (KEFLEX) 500 MG capsule Take 1 capsule (500 mg total) by mouth 4 (four) times daily. 09/05/16   Cuthriell, Delorise RoyalsJonathan D, PA-C  naproxen (NAPROSYN) 500 MG tablet Take 1 tablet (500 mg total) by mouth 2 (two) times daily with a meal. 01/17/18   Joni ReiningSmith, Ronald K, PA-C    Allergies Patient has no known allergies.  Family History  Problem Relation Age of Onset  . Neurofibromatosis Mother   . Thyroid disease Mother   . Diabetes Paternal Uncle   . Diabetes Maternal Grandfather   . Diabetes Paternal Grandfather     Social History Social History   Tobacco Use  . Smoking status: Never  Smoker  . Smokeless tobacco: Never Used  Substance Use Topics  . Alcohol use: No  . Drug use: No    Review of Systems Constitutional: No fever/chills Eyes: No visual changes. ENT: No sore throat. Cardiovascular: Denies chest pain. Respiratory: Denies shortness of breath. Gastrointestinal: No abdominal pain.  No nausea, no vomiting.  No diarrhea.  No constipation. Genitourinary: Negative for dysuria. Musculoskeletal: Negative for back pain. Skin: Negative for rash. Neurological: Positive for headaches, but denies focal weakness or numbness.   ____________________________________________   PHYSICAL EXAM:  VITAL SIGNS: ED Triage Vitals [01/17/18 1028]  Enc Vitals Group     BP 136/82     Pulse Rate 91     Resp 18     Temp 98.4 F (36.9 C)     Temp Source Oral     SpO2 99 %     Weight 240 lb (108.9 kg)     Height 5\' 8"  (1.727 m)     Head Circumference      Peak Flow      Pain Score 0     Pain Loc      Pain Edu?      Excl. in GC?     Constitutional: Alert and oriented. Well appearing and in no acute distress. Eyes: Conjunctivae are normal. PERRL. EOMI. Head: Atraumatic. Nose: No congestion/rhinnorhea. Mouth/Throat: Mucous membranes are moist.  Oropharynx non-erythematous. Neck: No stridor.  No cervical spine tenderness to palpation. Hematological/Lymphatic/Immunilogical: No cervical lymphadenopathy. Cardiovascular: Normal rate, regular rhythm. Grossly  normal heart sounds.  Good peripheral circulation. Respiratory: Normal respiratory effort.  No retractions. Lungs CTAB. Gastrointestinal: Soft and nontender. No distention. No abdominal bruits. No CVA tenderness. Musculoskeletal: No lower extremity tenderness nor edema.  No joint effusions. Neurologic:  Normal speech and language. No gross focal neurologic deficits are appreciated. No gait instability. Skin:  Skin is warm, dry and intact. No rash noted. Psychiatric: Mood and affect are normal. Speech and behavior are  normal.  ____________________________________________   LABS (all labs ordered are listed, but only abnormal results are displayed)  Labs Reviewed  CBC  BASIC METABOLIC PANEL   ____________________________________________  EKG   ____________________________________________  RADIOLOGY  ED MD interpretation:    Official radiology report(s): Ct Head Wo Contrast  Result Date: 01/17/2018 CLINICAL DATA:  Intermittent right temporal headache for approximately 2 days. EXAM: CT HEAD WITHOUT CONTRAST TECHNIQUE: Contiguous axial images were obtained from the base of the skull through the vertex without intravenous contrast. COMPARISON:  None. FINDINGS: Brain: No evidence of acute infarction, hemorrhage, hydrocephalus, extra-axial collection or mass lesion/mass effect. Vascular: No hyperdense vessel or unexpected calcification. Skull: Normal. Negative for fracture or focal lesion. Sinuses/Orbits: Negative. Other: None. IMPRESSION: Negative head CT. Electronically Signed   By: Drusilla Kanner M.D.   On: 01/17/2018 11:32    ____________________________________________   PROCEDURES  Procedure(s) performed: None  Procedures  Critical Care performed: No  ____________________________________________   INITIAL IMPRESSION / ASSESSMENT AND PLAN / ED COURSE  As part of my medical decision making, I reviewed the following data within the electronic MEDICAL RECORD NUMBER    Temporal headache x3 days.  Discussed negative CT findings with patient.  Patient will refer to neurology for definitive evaluation and treatment.  Advised on NSAIDs at this time.      ____________________________________________   FINAL CLINICAL IMPRESSION(S) / ED DIAGNOSES  Final diagnoses:  Right sided temporal headache     ED Discharge Orders         Ordered    naproxen (NAPROSYN) 500 MG tablet  2 times daily with meals     01/17/18 1143           Note:  This document was prepared using Dragon  voice recognition software and may include unintentional dictation errors.    Joni Reining, PA-C 01/17/18 1146    Emily Filbert, MD 01/17/18 1226

## 2019-01-29 IMAGING — CT CT HEAD W/O CM
3 series · 15 of 47 positions shown, 18 images · non-contrast
Comparison: None.

CLINICAL DATA: Intermittent right temporal headache for
approximately 2 days.

EXAM:
CT HEAD WITHOUT CONTRAST
TECHNIQUE: Contiguous axial images were obtained from the base of the skull
through the vertex without intravenous contrast.

[Series 2: head wo · axial · 0.47mm/px · z∈[+479,+609]mm · 9 of 32 slices shown, 12 images]
[im 3/32  brain]
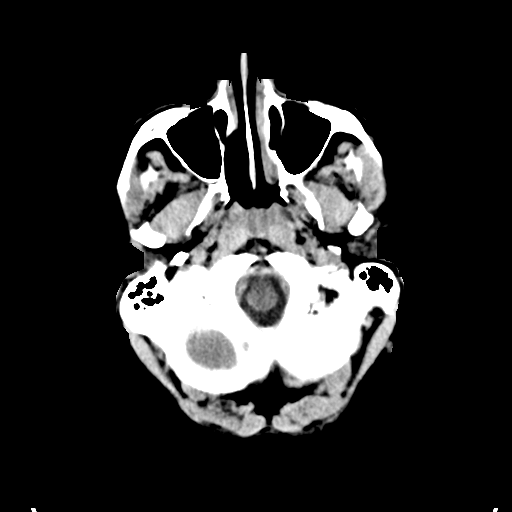
[im 3/32  bone]
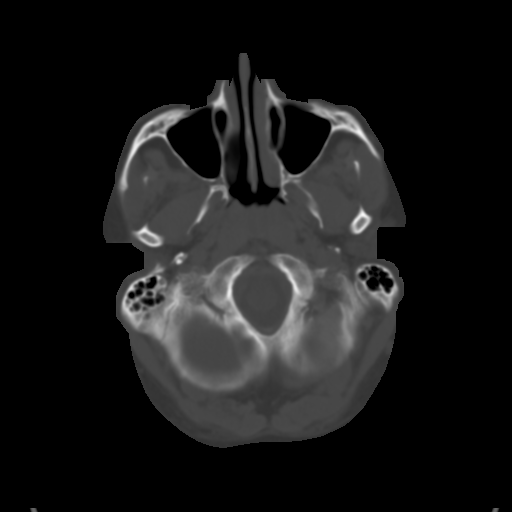
[im 6/32  brain]
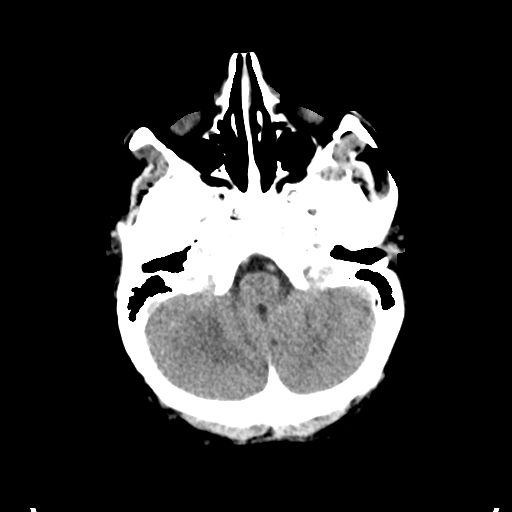
[im 9/32  brain]
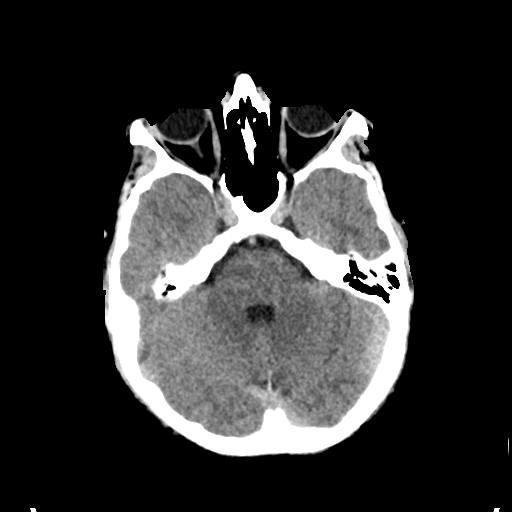
[im 12/32  brain]
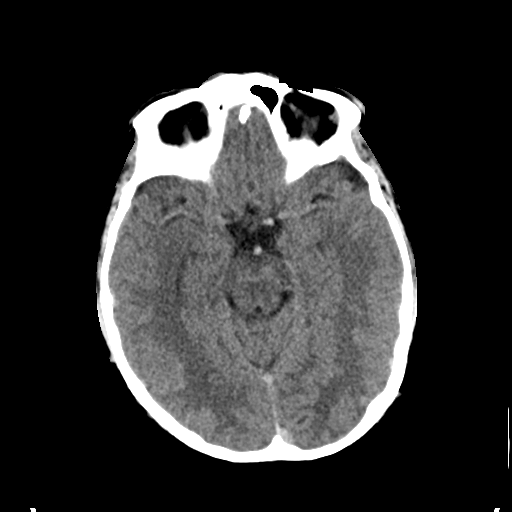
[im 17/32  brain]
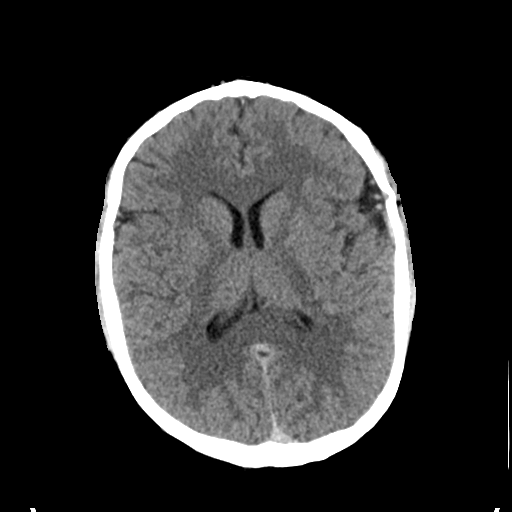
[im 17/32  bone]
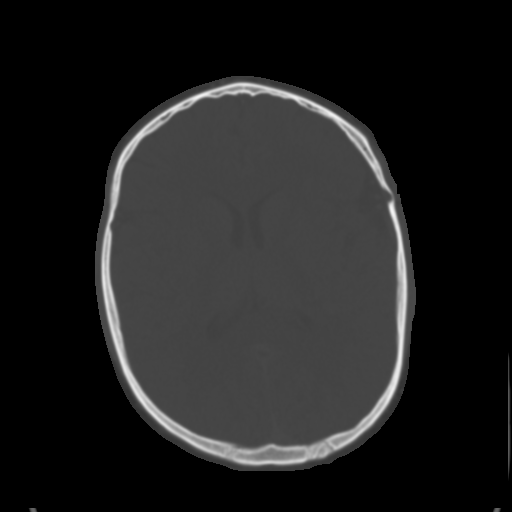
[im 20/32  brain]
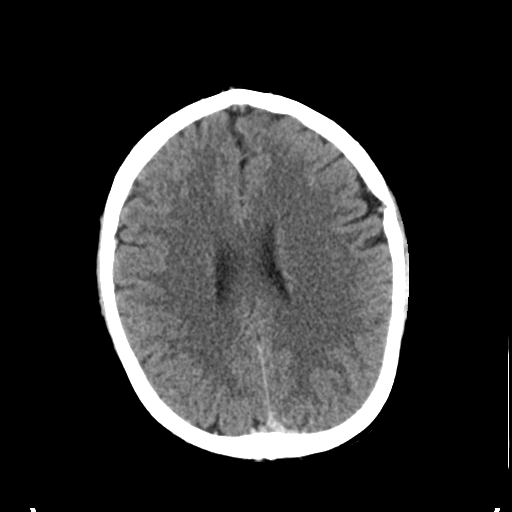
[im 23/32  brain]
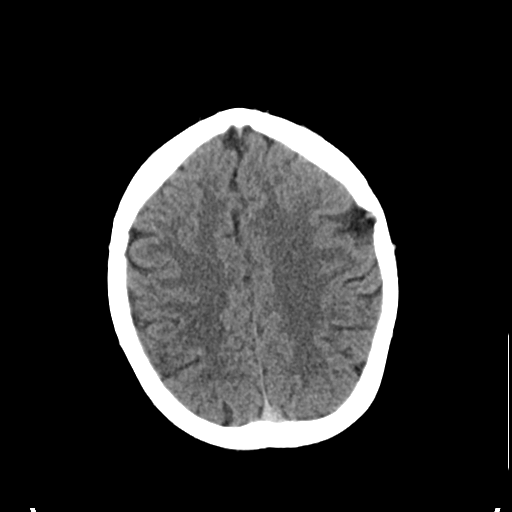
[im 26/32  brain]
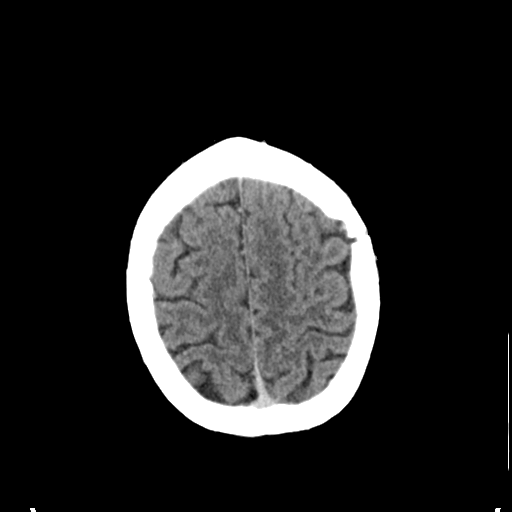
[im 29/32  brain]
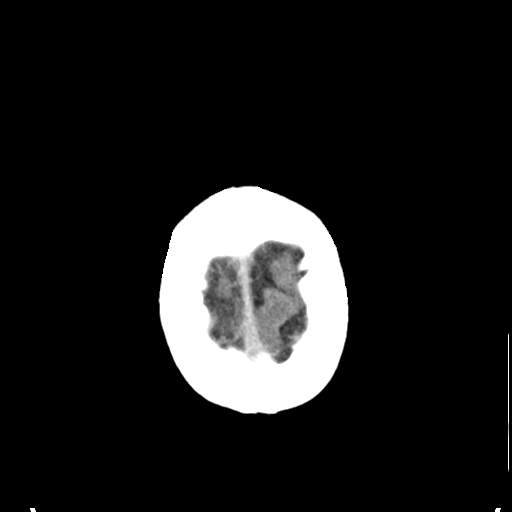
[im 29/32  bone]
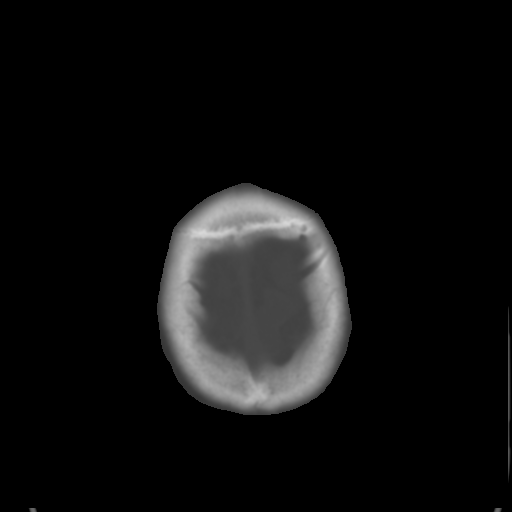

[Series 4: coronal soft tissue · coronal · 0.32mm/px · 3 of 62 slices shown]
[im 21/62  brain]
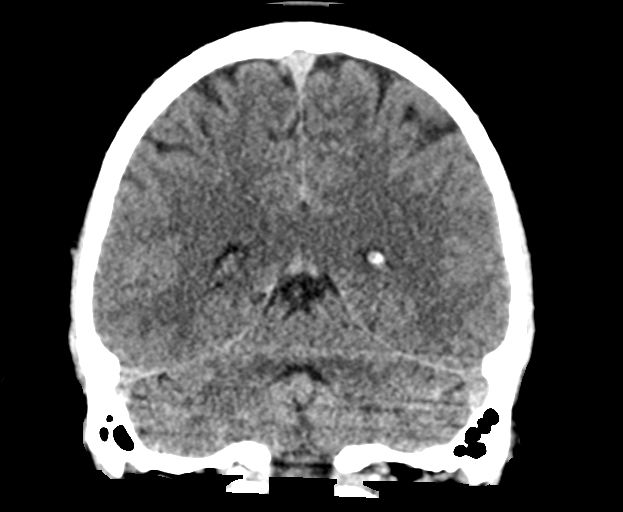
[im 28/62  brain]
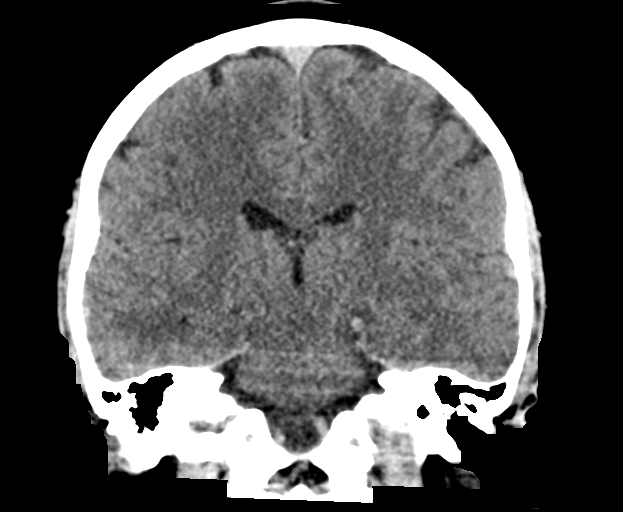
[im 34/62  brain]
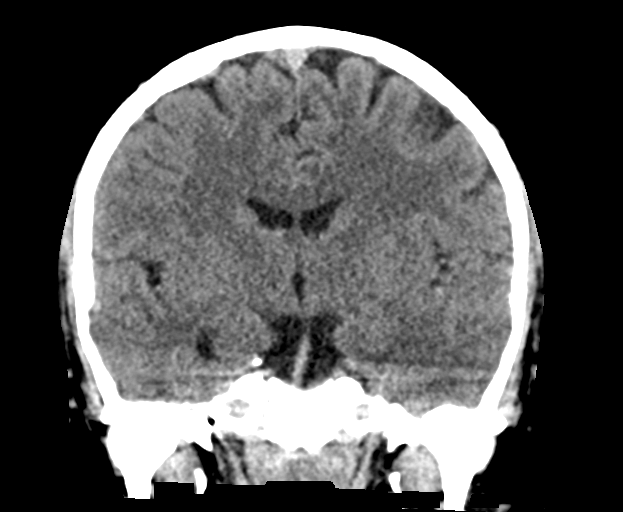

[Series 5: sagittal soft tissue · sagittal · 0.31mm/px · 3 of 53 slices shown]
[im 18/53  brain]
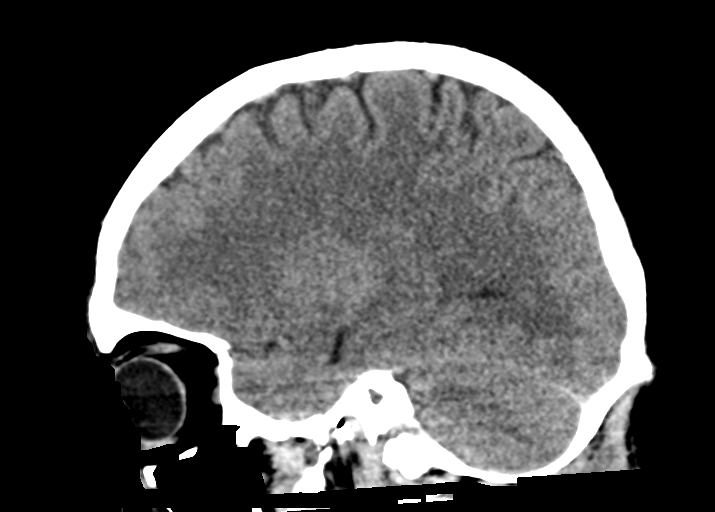
[im 27/53  brain]
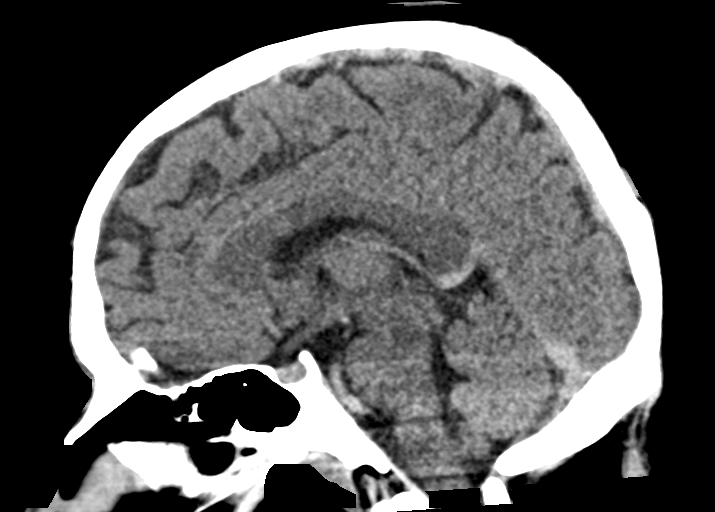
[im 35/53  brain]
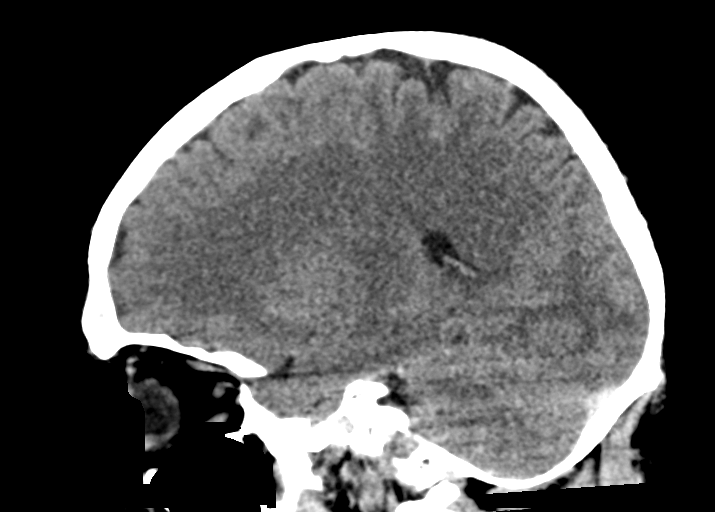

[15 of 47 positions shown; findings below may reference images not displayed]

FINDINGS: Brain: No evidence of acute infarction, hemorrhage, hydrocephalus,
extra-axial collection or mass lesion/mass effect.

Vascular: No hyperdense vessel or unexpected calcification.

Skull: Normal. Negative for fracture or focal lesion.

Sinuses/Orbits: Negative.

Other: None.
IMPRESSION: Negative head CT.

## 2021-06-04 ENCOUNTER — Emergency Department
Admission: EM | Admit: 2021-06-04 | Discharge: 2021-06-04 | Discharge status: Against Medical Advice | Payer: BLUE CROSS/BLUE SHIELD | Source: Home / Self Care

## 2021-06-05 ENCOUNTER — Emergency Department
Admission: EM | Admit: 2021-06-05 | Discharge: 2021-06-05 | Disposition: A | Discharge status: Home / Self Care | Payer: Self-pay | Attending: Emergency Medicine | Admitting: Emergency Medicine

## 2021-06-05 ENCOUNTER — Other Ambulatory Visit: Payer: Self-pay

## 2021-06-05 ENCOUNTER — Emergency Department: Payer: Self-pay

## 2021-06-05 DIAGNOSIS — M25862 Other specified joint disorders, left knee: Secondary | ICD-10-CM

## 2021-06-05 DIAGNOSIS — M7122 Synovial cyst of popliteal space [Baker], left knee: Secondary | ICD-10-CM | POA: Insufficient documentation

## 2021-06-05 DIAGNOSIS — Q8501 Neurofibromatosis, type 1: Secondary | ICD-10-CM | POA: Insufficient documentation

## 2021-06-05 NOTE — ED Provider Notes (Signed)
[facility] Provider Note    None    (approximate)   History   Knee Pain   HPI  Philip Cortez is a 26 y.o. male   presents to the ED with complaint of a lesion to the medial aspect of his left knee for 1 month.  Patient has a history of neurofibromatosis, type 1.  He states he has been seeing a doctor at [facility] and has had this since he was 26 years old.  He has been doing well.  His doctor retired and he states that the new doctor refused to see him any further as he did not agree to do experimental drugs.  Patient denies any recent injury to his knee.  Continues to ambulate without any assistance.  Rates pain as 2 out of 10.      Physical Exam   Triage Vital Signs: ED Triage Vitals [06/05/21 0132]  Enc Vitals Group     BP (!) 141/81     Pulse Rate 64     Resp 17     Temp 98.2 F (36.8 C)     Temp Source Oral     SpO2 98 %     Weight 235 lb (106.6 kg)     Height 5\' 8"  (1.727 m)     Head Circumference      Peak Flow      Pain Score 2     Pain Loc      Pain Edu?      Excl. in [place]?     Most recent vital signs: Vitals:   06/05/21 0132 06/05/21 0818  BP: (!) 141/81 131/89  Pulse: 64   Resp: 17 16  Temp: 98.2 F (36.8 C)   SpO2: 98% 97%     General: Awake, no distress.  Ambulatory without any difficulty or assistance. CV:  Good peripheral perfusion.  Heart regular rate and rhythm without murmur. Resp:  Normal effort.  Lungs are clear bilaterally. Abd:  No distention.  Other:  On examination of left knee there is a soft nontender cystic lesion to the medial aspect.  No erythema, warmth is noted on palpation.  Patient is able to flex and extend his knee without any difficulty.  No effusion present.  No edema noted lower extremity.   ED Results / Procedures / Treatments   Labs (all labs ordered are listed, but only abnormal results are displayed) Labs Reviewed - No data to display   RADIOLOGY  Left knee x-ray reviewed and  radiology report.  No acute bony changes are noted.  There is a small oval shaped area that appears cystic.  Radiology suggest an outpatient ultrasound of this area to see if it is solid or fluid-filled.  PROCEDURES:  Critical Care performed: No  Procedures   MEDICATIONS ORDERED IN ED: Medications - No data to display   IMPRESSION / MDM / [place] / ED COURSE  I reviewed the triage vital signs and the nursing notes.                              Differential diagnosis includes, but is not limited to, left knee pain, left knee injury, left knee bursitis, soft tissue mass, neurofibromatosis complication.  27 year old male presents to the ED with concerns of an area that showed on his left knee for the last month.  Patient has a history of neurofibromatosis, type I.  He  has been followed by a neurologist at [facility] and states that he was told that he has been "let go" from that clinic as he would not agree to some experimental drugs that a new doctor wanted to try.  Patient states that he has had the disease since age 22 and is done well.  He is not have a local PCP and actually lives in [place].  The neurologist on-call for [facility] today is actually a neurologist that is located in [place].  This contact information was given to the patient if he is unable to see anyone in neurology at [facility].  Also a list of possible PCPs was given to him as well so that he may establish as a medical patient for medical needs.   FINAL CLINICAL IMPRESSION(S) / ED DIAGNOSES   Final diagnoses:  Cyst of left knee joint  Neurofibromatosis, type 1 ([place])     Rx / DC Orders   ED Discharge Orders     None        Note:  This document was prepared using Dragon voice recognition software and may include unintentional dictation errors.   Johnn Hai, PA-C 06/05/21 1539    Nena Polio, MD 06/05/21 1622

## 2021-06-05 NOTE — Discharge Instructions (Addendum)
Dr. Thomasena Edis is the neurologist on-call and his office is in [place].  His contact information and address are listed on your discharge papers.  Also the [facility] and [facility] or 2 clinics that are available to you that charge based on your income.  You may also call the clinic in [facility] that you were seeing to set up an appointment.

## 2021-06-05 NOTE — ED Triage Notes (Signed)
Pt presents to ER c/o knot on left knee.  Pt states he has hx of Neurofibromatosis and is unsure is this is a tumor that is coming from that or a cyst.  Area on knee is raised on left inside of knee.  Pt states area has become sore since he noticed it around a month ago.  Pt ambulatory to triage.

## 2021-07-01 ENCOUNTER — Encounter (HOSPITAL_COMMUNITY): Payer: Self-pay | Admitting: Radiology

## 2022-06-17 IMAGING — CR DG KNEE 1-2V*L*
1 series · 3 of 3 positions shown · non-contrast
Comparison: None.

CLINICAL DATA: History of neural fibromatosis presenting with a
knot on the left knee.

EXAM:
LEFT KNEE - 1-2 VIEW

[Series 1: dg knee 1-2 views left · 0.14mm/px · 3 of 3 slices shown]
[im 1/3]
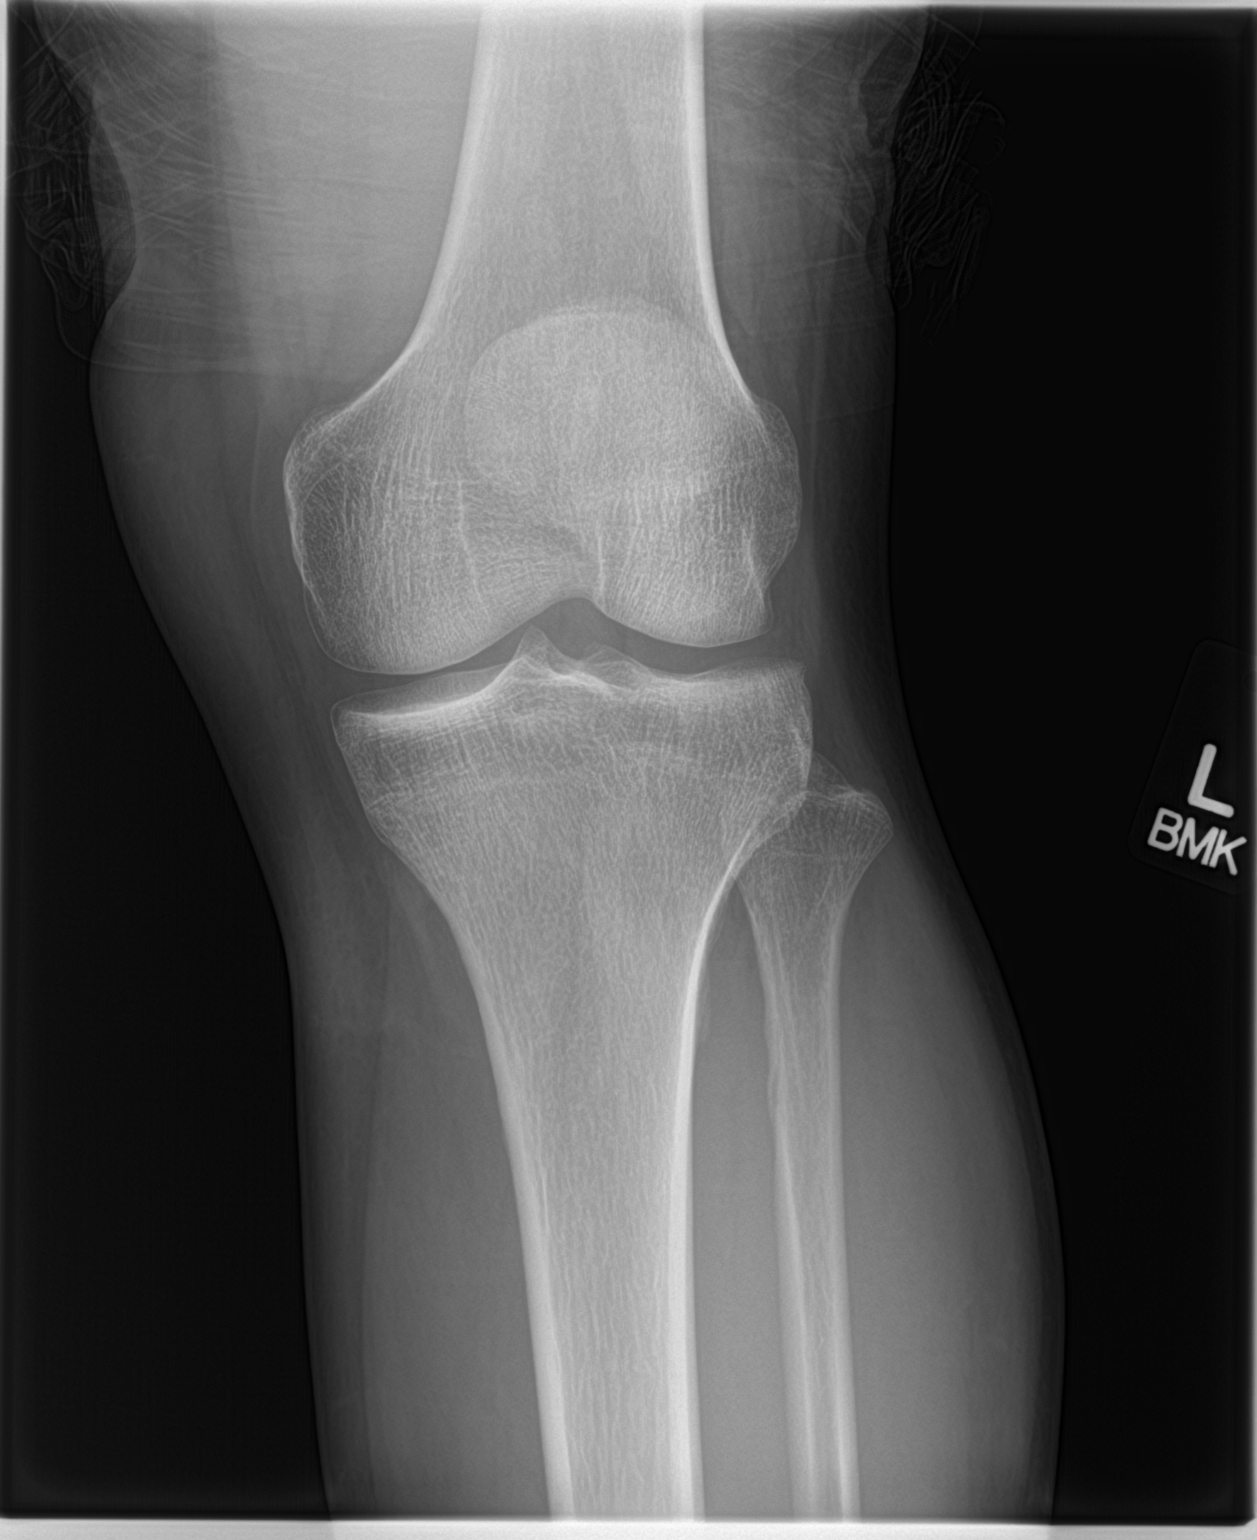
[im 2/3]
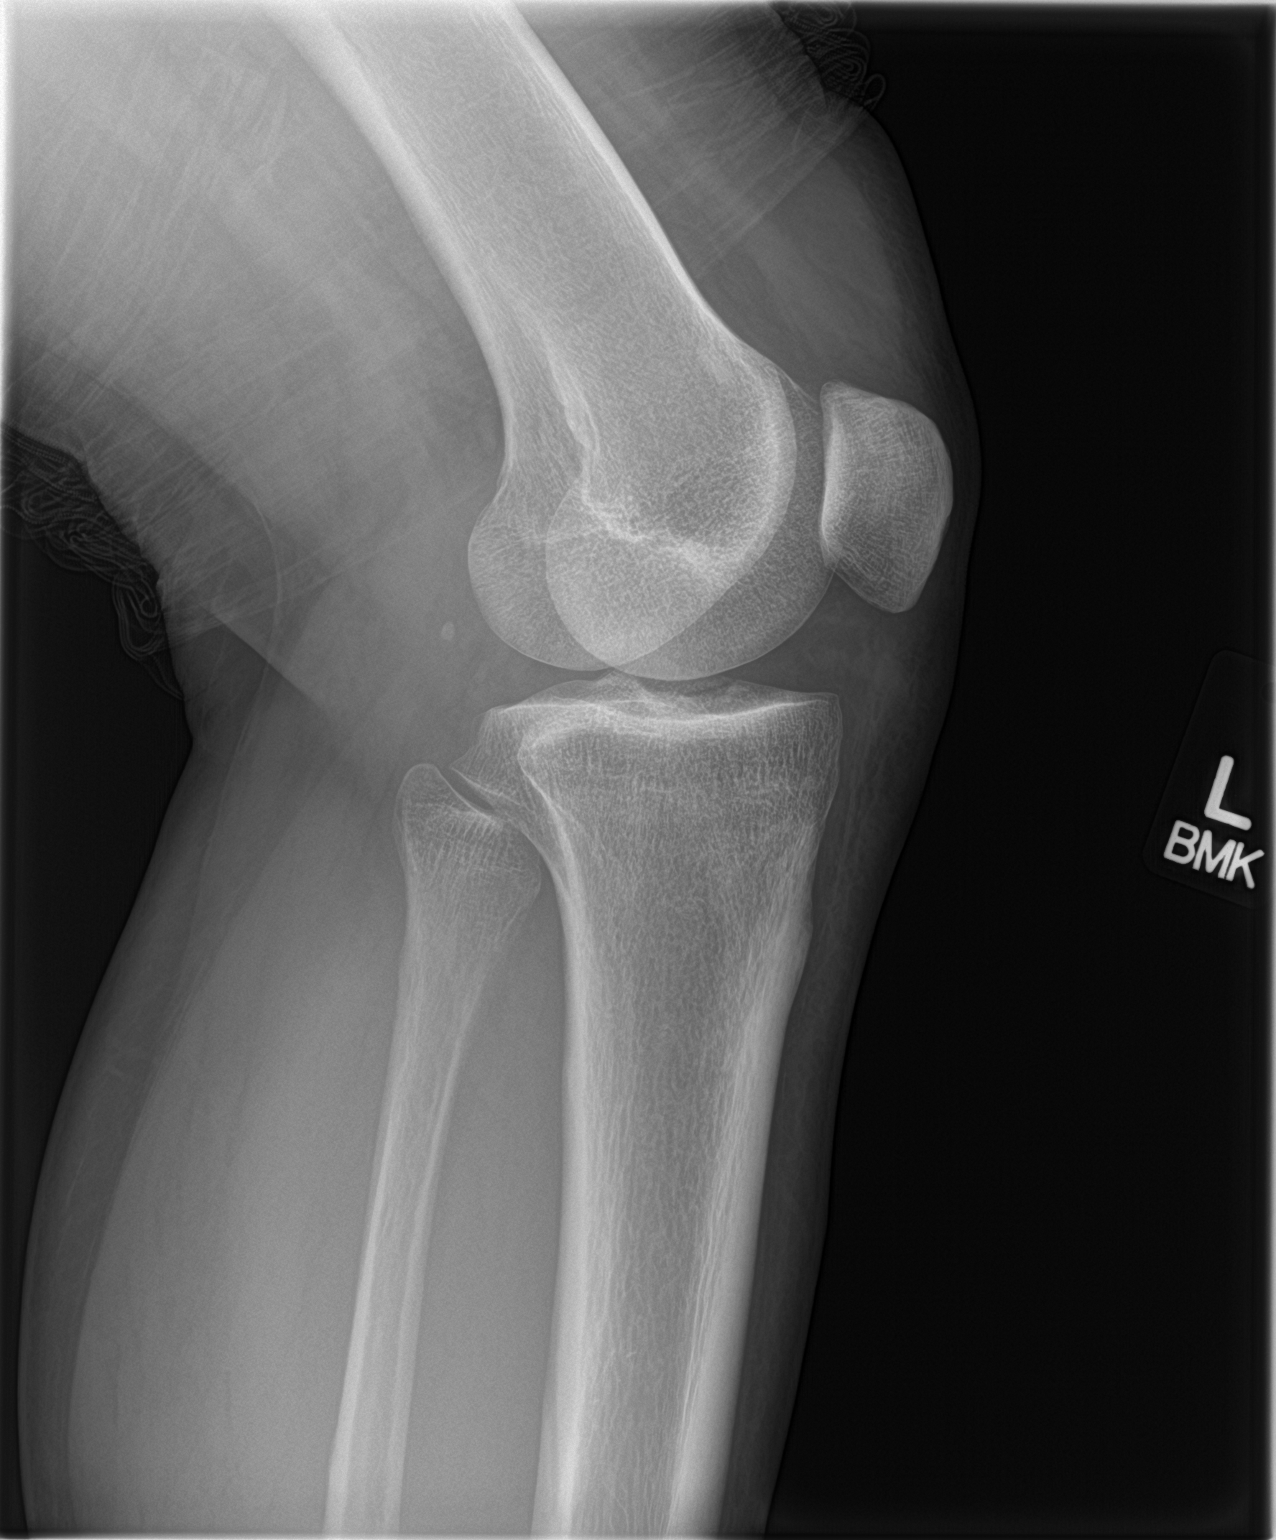
[im 3/3]
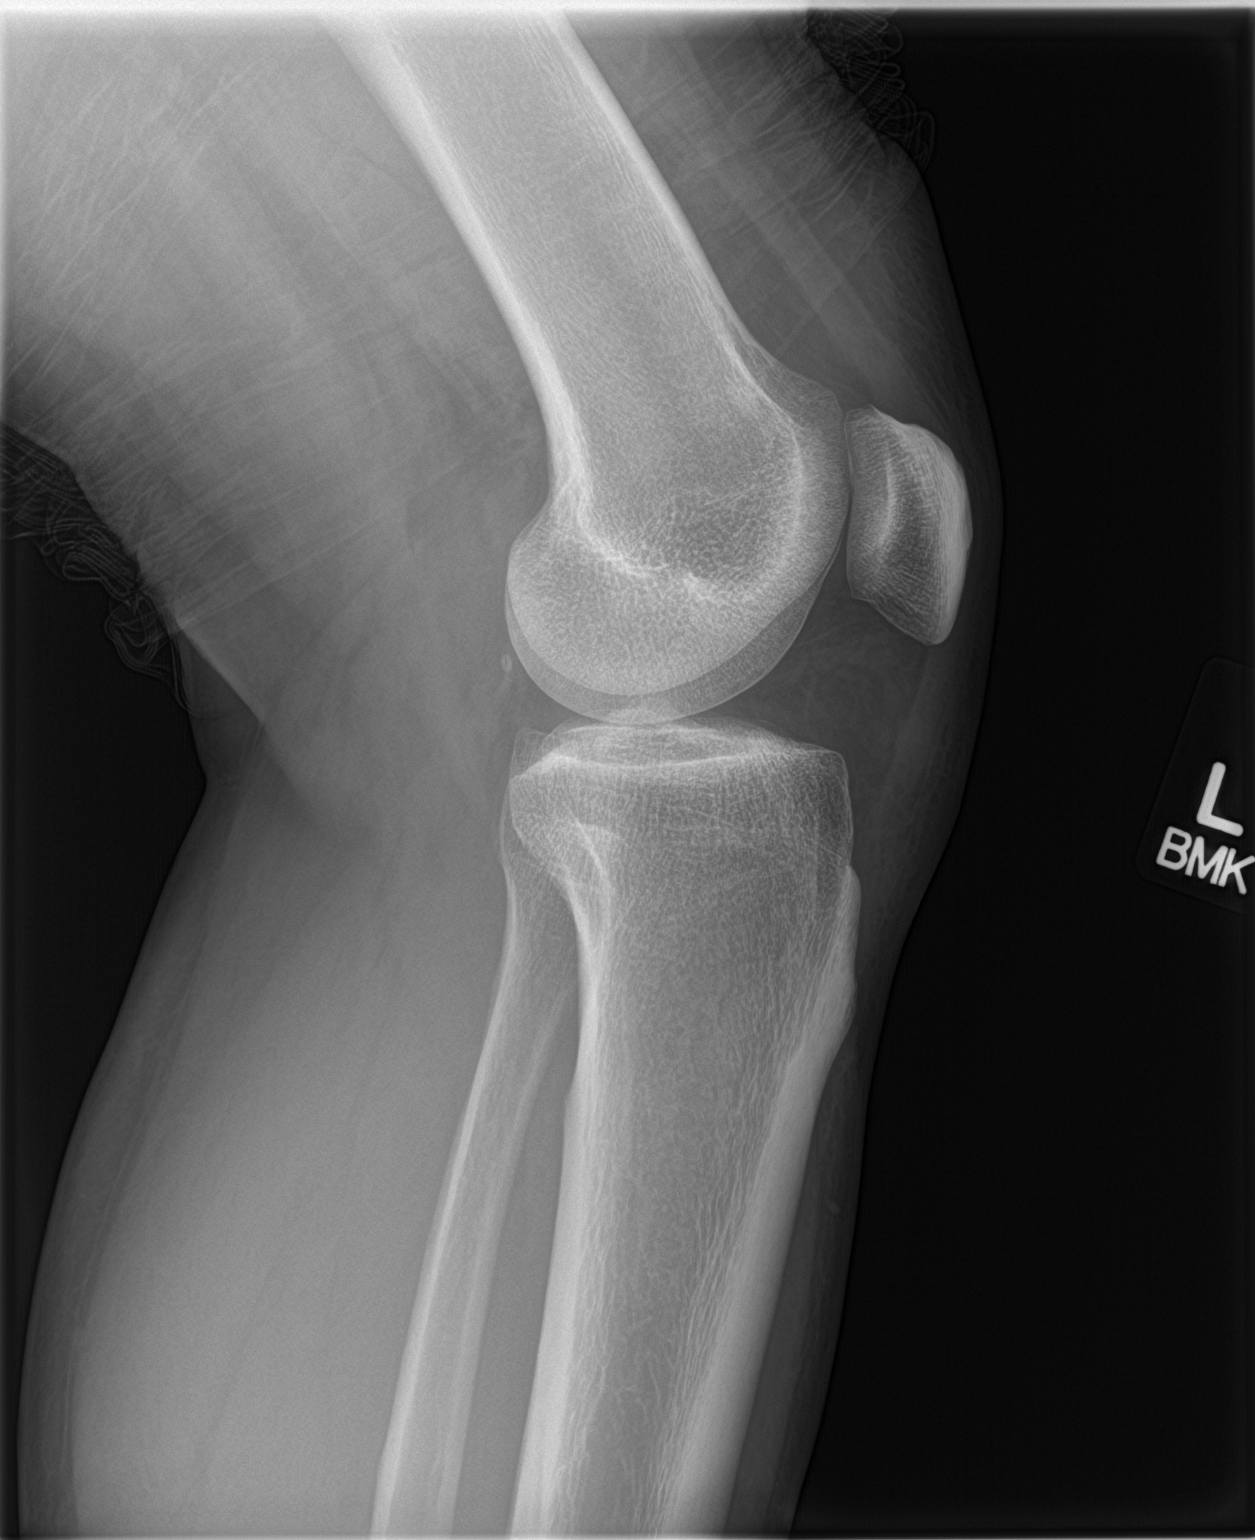

[3 of 3 positions shown; findings below may reference images not displayed]

FINDINGS: No evidence of fracture, dislocation, or joint effusion. No evidence
of arthropathy or other focal bone abnormality. A very faint, 19 mm
x 9 mm oval-shaped opacity is seen within the medial soft tissues at
the level of the supracondylar portion of the distal left femur.
IMPRESSION: Very faint oval-shaped opacity within the medial soft tissues of the
left knee, as described above, which may correlate to the palpable
abnormality described on physical examination. Further evaluation of
the palpable abnormality with a non emergent soft tissue ultrasound
is recommended to determine if this is solid or cystic in nature.
